# Patient Record
Sex: Male | Born: 1970 | Race: White | Hispanic: No | Smoking: Current every day smoker
Health system: Southern US, Community
[De-identification: ages and names within clinical notes are randomized; demographics above are authoritative.]

## PROBLEM LIST (undated history)

## (undated) DIAGNOSIS — S02609A Fracture of mandible, unspecified, initial encounter for closed fracture: Secondary | ICD-10-CM

## (undated) HISTORY — PX: MANDIBLE SURGERY: SHX707

---

## 1997-09-12 ENCOUNTER — Emergency Department (HOSPITAL_COMMUNITY): Admission: EM | Admit: 1997-09-12 | Discharge: 1997-09-12 | Payer: Self-pay | Admitting: Emergency Medicine

## 2001-04-02 ENCOUNTER — Emergency Department (HOSPITAL_COMMUNITY): Admission: EM | Admit: 2001-04-02 | Discharge: 2001-04-02 | Payer: Self-pay | Admitting: Emergency Medicine

## 2002-06-23 ENCOUNTER — Emergency Department (HOSPITAL_COMMUNITY): Admission: EM | Admit: 2002-06-23 | Discharge: 2002-06-23 | Payer: Self-pay | Admitting: Emergency Medicine

## 2002-09-20 ENCOUNTER — Emergency Department (HOSPITAL_COMMUNITY): Admission: EM | Admit: 2002-09-20 | Discharge: 2002-09-21 | Payer: Self-pay | Admitting: Emergency Medicine

## 2002-09-21 ENCOUNTER — Encounter: Payer: Self-pay | Admitting: Emergency Medicine

## 2002-09-28 ENCOUNTER — Emergency Department (HOSPITAL_COMMUNITY): Admission: EM | Admit: 2002-09-28 | Discharge: 2002-09-28 | Payer: Self-pay | Admitting: Emergency Medicine

## 2004-07-13 ENCOUNTER — Emergency Department (HOSPITAL_COMMUNITY): Admission: EM | Admit: 2004-07-13 | Discharge: 2004-07-13 | Payer: Self-pay | Admitting: Emergency Medicine

## 2005-04-02 ENCOUNTER — Emergency Department (HOSPITAL_COMMUNITY): Admission: EM | Admit: 2005-04-02 | Discharge: 2005-04-02 | Payer: Self-pay | Admitting: Family Medicine

## 2005-04-09 ENCOUNTER — Emergency Department (HOSPITAL_COMMUNITY): Admission: EM | Admit: 2005-04-09 | Discharge: 2005-04-09 | Payer: Self-pay | Admitting: Family Medicine

## 2005-11-18 ENCOUNTER — Emergency Department (HOSPITAL_COMMUNITY): Admission: EM | Admit: 2005-11-18 | Discharge: 2005-11-18 | Payer: Self-pay | Admitting: Emergency Medicine

## 2006-02-18 ENCOUNTER — Emergency Department (HOSPITAL_COMMUNITY): Admission: EM | Admit: 2006-02-18 | Discharge: 2006-02-18 | Payer: Self-pay | Admitting: Emergency Medicine

## 2006-02-19 ENCOUNTER — Ambulatory Visit: Payer: Self-pay | Admitting: Family Medicine

## 2006-02-21 ENCOUNTER — Ambulatory Visit (HOSPITAL_COMMUNITY): Admission: RE | Admit: 2006-02-21 | Discharge: 2006-02-21 | Payer: Self-pay | Admitting: Internal Medicine

## 2006-07-04 ENCOUNTER — Emergency Department (HOSPITAL_COMMUNITY): Admission: EM | Admit: 2006-07-04 | Discharge: 2006-07-04 | Payer: Self-pay | Admitting: Emergency Medicine

## 2007-10-30 ENCOUNTER — Emergency Department (HOSPITAL_COMMUNITY): Admission: EM | Admit: 2007-10-30 | Discharge: 2007-10-30 | Payer: Self-pay | Admitting: Emergency Medicine

## 2008-07-29 ENCOUNTER — Emergency Department (HOSPITAL_COMMUNITY): Admission: EM | Admit: 2008-07-29 | Discharge: 2008-07-29 | Payer: Self-pay | Admitting: Emergency Medicine

## 2008-08-01 ENCOUNTER — Emergency Department (HOSPITAL_COMMUNITY): Admission: EM | Admit: 2008-08-01 | Discharge: 2008-08-01 | Payer: Self-pay | Admitting: Emergency Medicine

## 2008-11-02 ENCOUNTER — Emergency Department (HOSPITAL_COMMUNITY): Admission: EM | Admit: 2008-11-02 | Discharge: 2008-11-02 | Payer: Self-pay | Admitting: Emergency Medicine

## 2010-07-15 LAB — CBC
HCT: 44.8 % (ref 39.0–52.0)
Hemoglobin: 15.6 g/dL (ref 13.0–17.0)
MCHC: 35 g/dL (ref 30.0–36.0)
MCV: 91.6 fL (ref 78.0–100.0)
Platelets: 308 10*3/uL (ref 150–400)
RDW: 12.7 % (ref 11.5–15.5)
WBC: 7 10*3/uL (ref 4.0–10.5)

## 2010-07-15 LAB — POCT I-STAT, CHEM 8
BUN: 16 mg/dL (ref 6–23)
Chloride: 108 mEq/L (ref 96–112)
Creatinine, Ser: 0.7 mg/dL (ref 0.4–1.5)
HCT: 47 % (ref 39.0–52.0)
Hemoglobin: 16 g/dL (ref 13.0–17.0)
Potassium: 3.8 mEq/L (ref 3.5–5.1)
Sodium: 136 mEq/L (ref 135–145)
TCO2: 19 mmol/L (ref 0–100)

## 2010-07-15 LAB — DIFFERENTIAL
Eosinophils Absolute: 0.1 10*3/uL (ref 0.0–0.7)
Monocytes Relative: 9 % (ref 3–12)
Neutro Abs: 3.9 10*3/uL (ref 1.7–7.7)

## 2010-07-15 LAB — POCT CARDIAC MARKERS

## 2010-07-15 LAB — RAPID URINE DRUG SCREEN, HOSP PERFORMED: Benzodiazepines: NOT DETECTED

## 2010-07-18 LAB — POCT I-STAT, CHEM 8
BUN: 6 mg/dL (ref 6–23)
Calcium, Ion: 1.17 mmol/L (ref 1.12–1.32)
Chloride: 104 mEq/L (ref 96–112)
Creatinine, Ser: 0.9 mg/dL (ref 0.4–1.5)
Glucose, Bld: 104 mg/dL — ABNORMAL HIGH (ref 70–99)
Potassium: 3.7 mEq/L (ref 3.5–5.1)

## 2010-11-03 ENCOUNTER — Emergency Department (HOSPITAL_COMMUNITY): Payer: Self-pay

## 2010-11-03 ENCOUNTER — Emergency Department (HOSPITAL_COMMUNITY)
Admission: EM | Admit: 2010-11-03 | Discharge: 2010-11-03 | Disposition: A | Discharge status: Home / Self Care | Payer: Self-pay | Attending: Emergency Medicine | Admitting: Emergency Medicine

## 2010-11-03 DIAGNOSIS — R220 Localized swelling, mass and lump, head: Secondary | ICD-10-CM | POA: Insufficient documentation

## 2010-11-03 DIAGNOSIS — Y998 Other external cause status: Secondary | ICD-10-CM | POA: Insufficient documentation

## 2010-11-03 DIAGNOSIS — R6884 Jaw pain: Secondary | ICD-10-CM | POA: Insufficient documentation

## 2010-11-03 DIAGNOSIS — R109 Unspecified abdominal pain: Secondary | ICD-10-CM

## 2010-11-03 DIAGNOSIS — S0180XA Unspecified open wound of other part of head, initial encounter: Secondary | ICD-10-CM | POA: Insufficient documentation

## 2010-11-03 DIAGNOSIS — S02620A Fracture of subcondylar process of mandible, unspecified side, initial encounter for closed fracture: Secondary | ICD-10-CM | POA: Insufficient documentation

## 2010-11-03 DIAGNOSIS — G8911 Acute pain due to trauma: Secondary | ICD-10-CM

## 2010-11-03 DIAGNOSIS — R51 Headache: Secondary | ICD-10-CM | POA: Insufficient documentation

## 2010-11-03 DIAGNOSIS — S02640A Fracture of ramus of mandible, unspecified side, initial encounter for closed fracture: Secondary | ICD-10-CM | POA: Insufficient documentation

## 2010-11-03 DIAGNOSIS — S02600A Fracture of unspecified part of body of mandible, initial encounter for closed fracture: Secondary | ICD-10-CM | POA: Insufficient documentation

## 2010-11-03 DIAGNOSIS — S81009A Unspecified open wound, unspecified knee, initial encounter: Secondary | ICD-10-CM | POA: Insufficient documentation

## 2010-11-03 LAB — POCT I-STAT, CHEM 8
Calcium, Ion: 1.08 mmol/L — ABNORMAL LOW (ref 1.12–1.32)
Chloride: 102 mEq/L (ref 96–112)
Glucose, Bld: 117 mg/dL — ABNORMAL HIGH (ref 70–99)
Hemoglobin: 19.4 g/dL — ABNORMAL HIGH (ref 13.0–17.0)
Potassium: 3.9 mEq/L (ref 3.5–5.1)
Sodium: 138 mEq/L (ref 135–145)
TCO2: 24 mmol/L (ref 0–100)

## 2010-11-03 LAB — BASIC METABOLIC PANEL
BUN: 6 mg/dL (ref 6–23)
Calcium: 9.2 mg/dL (ref 8.4–10.5)
Creatinine, Ser: 0.68 mg/dL (ref 0.50–1.35)
GFR calc non Af Amer: >60 mL/min (ref >60)
Glucose, Bld: 121 mg/dL — ABNORMAL HIGH (ref 70–99)

## 2010-11-03 LAB — CBC
Hemoglobin: 18.8 g/dL — ABNORMAL HIGH (ref 13.0–17.0)
MCH: 32.8 pg (ref 26.0–34.0)
RBC: 5.74 MIL/uL (ref 4.22–5.81)
WBC: 14 10*3/uL — ABNORMAL HIGH (ref 4.0–10.5)
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
## 2010-12-01 ENCOUNTER — Emergency Department (HOSPITAL_COMMUNITY)
Admission: EM | Admit: 2010-12-01 | Discharge: 2010-12-01 | Disposition: A | Discharge status: Home / Self Care | Payer: Self-pay | Attending: Emergency Medicine | Admitting: Emergency Medicine

## 2010-12-01 ENCOUNTER — Emergency Department (HOSPITAL_COMMUNITY): Payer: Self-pay

## 2010-12-01 DIAGNOSIS — R112 Nausea with vomiting, unspecified: Secondary | ICD-10-CM | POA: Insufficient documentation

## 2010-12-01 DIAGNOSIS — S02609A Fracture of mandible, unspecified, initial encounter for closed fracture: Secondary | ICD-10-CM | POA: Insufficient documentation

## 2010-12-01 DIAGNOSIS — R079 Chest pain, unspecified: Secondary | ICD-10-CM | POA: Insufficient documentation

## 2010-12-01 DIAGNOSIS — S2239XA Fracture of one rib, unspecified side, initial encounter for closed fracture: Secondary | ICD-10-CM | POA: Insufficient documentation

## 2010-12-01 DIAGNOSIS — R51 Headache: Secondary | ICD-10-CM | POA: Insufficient documentation

## 2010-12-01 DIAGNOSIS — X58XXXA Exposure to other specified factors, initial encounter: Secondary | ICD-10-CM | POA: Insufficient documentation

## 2011-09-12 ENCOUNTER — Encounter (HOSPITAL_COMMUNITY): Payer: Self-pay | Admitting: Family Medicine

## 2011-09-12 ENCOUNTER — Encounter (HOSPITAL_COMMUNITY): Payer: Self-pay | Admitting: *Deleted

## 2011-09-12 ENCOUNTER — Emergency Department (HOSPITAL_COMMUNITY): Payer: Self-pay

## 2011-09-12 ENCOUNTER — Emergency Department (HOSPITAL_COMMUNITY)
Admission: EM | Admit: 2011-09-12 | Discharge: 2011-09-12 | Disposition: A | Discharge status: Home / Self Care | Payer: Self-pay | Attending: Emergency Medicine | Admitting: Emergency Medicine

## 2011-09-12 DIAGNOSIS — L03211 Cellulitis of face: Secondary | ICD-10-CM | POA: Insufficient documentation

## 2011-09-12 DIAGNOSIS — K0889 Other specified disorders of teeth and supporting structures: Secondary | ICD-10-CM

## 2011-09-12 DIAGNOSIS — R220 Localized swelling, mass and lump, head: Secondary | ICD-10-CM | POA: Insufficient documentation

## 2011-09-12 DIAGNOSIS — F17200 Nicotine dependence, unspecified, uncomplicated: Secondary | ICD-10-CM | POA: Insufficient documentation

## 2011-09-12 DIAGNOSIS — L0201 Cutaneous abscess of face: Secondary | ICD-10-CM | POA: Insufficient documentation

## 2011-09-12 DIAGNOSIS — K029 Dental caries, unspecified: Secondary | ICD-10-CM

## 2011-09-12 HISTORY — DX: Fracture of mandible, unspecified, initial encounter for closed fracture: S02.609A

## 2011-09-12 MED ORDER — CLINDAMYCIN HCL 300 MG PO CAPS: 300.0000 mg | ORAL_CAPSULE | Freq: Three times a day (TID) | ORAL | Status: DC

## 2011-09-12 MED ORDER — OXYCODONE-ACETAMINOPHEN 5-325 MG PO TABS
2.0000 | ORAL_TABLET | Freq: Once | ORAL | Status: AC
  Administered 2011-09-12: 2 via ORAL
  Filled 2011-09-12: qty 2

## 2011-09-12 MED ORDER — PENICILLIN V POTASSIUM 500 MG PO TABS: 500.0000 mg | ORAL_TABLET | Freq: Three times a day (TID) | ORAL | Status: AC

## 2011-09-12 MED ORDER — HYDROMORPHONE HCL PF 1 MG/ML IJ SOLN
1.0000 mg | Freq: Once | INTRAMUSCULAR | Status: AC
  Administered 2011-09-12: 1 mg via INTRAVENOUS
  Filled 2011-09-12: qty 1

## 2011-09-12 MED ORDER — CLINDAMYCIN PHOSPHATE 600 MG/50ML IV SOLN
600.0000 mg | Freq: Once | INTRAVENOUS | Status: AC
  Administered 2011-09-12: 600 mg via INTRAVENOUS
  Filled 2011-09-12: qty 50

## 2011-09-12 MED ORDER — LIDOCAINE VISCOUS 2 % MT SOLN
20.0000 mL | Freq: Once | OROMUCOSAL | Status: AC
  Administered 2011-09-12: 20 mL via OROMUCOSAL
  Filled 2011-09-12: qty 20

## 2011-09-12 MED ORDER — OXYCODONE-ACETAMINOPHEN 5-325 MG PO TABS: 2.0000 | ORAL_TABLET | ORAL | Status: AC | PRN

## 2011-09-12 MED ORDER — PENICILLIN G BENZATHINE 1200000 UNIT/2ML IM SUSP
1.2000 10*6.[IU] | Freq: Once | INTRAMUSCULAR | Status: AC
  Administered 2011-09-12: 1.2 10*6.[IU] via INTRAMUSCULAR
  Filled 2011-09-12: qty 2

## 2011-09-12 MED ORDER — HYDROMORPHONE HCL PF 1 MG/ML IJ SOLN
1.0000 mg | Freq: Once | INTRAMUSCULAR | Status: AC
Start: 2011-09-12 — End: 2011-09-12
  Administered 2011-09-12: 1 mg via INTRAVENOUS
  Filled 2011-09-12: qty 1

## 2011-09-12 MED ORDER — KETOROLAC TROMETHAMINE 30 MG/ML IJ SOLN
30.0000 mg | Freq: Once | INTRAMUSCULAR | Status: AC
  Administered 2011-09-12: 30 mg via INTRAVENOUS
  Filled 2011-09-12: qty 1

## 2011-09-12 NOTE — Discharge Instructions (Signed)
Please follow-up with Dr. Russella Dar within 24-48 hours to receive care. Be sure to take Clindamycin, given to you here today in the Emergency Department.  Dental Pain A tooth ache may be caused by cavities (tooth decay). Cavities expose the nerve of the tooth to air and hot or cold temperatures. It may come from an infection or abscess (also called a boil or furuncle) around your tooth. It is also often caused by dental caries (tooth decay). This causes the pain you are having. DIAGNOSIS  Your caregiver can diagnose this problem by exam. TREATMENT   If caused by an infection, it may be treated with medications which kill germs (antibiotics) and pain medications as prescribed by your caregiver. Take medications as directed.   Only take over-the-counter or prescription medicines for pain, discomfort, or fever as directed by your caregiver.   Whether the tooth ache today is caused by infection or dental disease, you should see your dentist as soon as possible for further care.  SEEK MEDICAL CARE IF: The exam and treatment you received today has been provided on an emergency basis only. This is not a substitute for complete medical or dental care. If your problem worsens or new problems (symptoms) appear, and you are unable to meet with your dentist, call or return to this location. SEEK IMMEDIATE MEDICAL CARE IF:   You have a fever.   You develop redness and swelling of your face, jaw, or neck.   You are unable to open your mouth.   You have severe pain uncontrolled by pain medicine.  MAKE SURE YOU:   Understand these instructions.   Will watch your condition.   Will get help right away if you are not doing well or get worse.  Document Released: 03/25/2005 Document Revised: 03/14/2011 Document Reviewed: 11/11/2007 [facility] Patient Information 2012 [place], [place].  Cellulitis Cellulitis is an infection of the skin and the tissue beneath it. The area is typically red and tender. It is  caused by germs (bacteria) (usually staph or strep) that enter the body through cuts or sores. Cellulitis most commonly occurs in the arms or lower legs.  HOME CARE INSTRUCTIONS   If you are given a prescription for medications which kill germs (antibiotics), take as directed until finished.   If the infection is on the arm or leg, keep the limb elevated as able.   Use a warm cloth several times per day to relieve pain and encourage healing.   See your caregiver for recheck of the infected site as directed if problems arise.   Only take over-the-counter or prescription medicines for pain, discomfort, or fever as directed by your caregiver.  SEEK MEDICAL CARE IF:   The area of redness (inflammation) is spreading, there are red streaks coming from the infected site, or if a part of the infection begins to turn dark in color.   The joint or bone underneath the infected skin becomes painful after the skin has healed.   The infection returns in the same or another area after it seems to have gone away.   A boil or bump swells up. This may be an abscess.   New, unexplained problems such as pain or fever develop.  SEEK IMMEDIATE MEDICAL CARE IF:   You have a fever.   You or your child feels drowsy or lethargic.   There is vomiting, diarrhea, or lasting discomfort or feeling ill (malaise) with muscle aches and pains.  MAKE SURE YOU:   Understand these instructions.  Will watch your condition.   Will get help right away if you are not doing well or get worse.  Document Released: 01/02/2005 Document Revised: 03/14/2011 Document Reviewed: 11/11/2007 [facility] Patient Information 2012 [place], [place].

## 2011-09-12 NOTE — ED Notes (Signed)
Patient transported to X-ray 

## 2011-09-12 NOTE — Progress Notes (Signed)
Self [place] pt with left side face swelling from tooth infection PA contacted ED CM to assist with [facility] indigent medication assistance .  CM spoke with pt about [facility] indigent medication program, dental resources and self pay [place] [facility] ([facility], [facility] and [facility]) Reviewed generic medications from discount pharmacies and financial assistance resources Rx for clindamycin walked to [facility] pharmacy after confirming with Riki Rusk that pt is eligible for [facility] medication assistance
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
## 2011-09-12 NOTE — Discharge Instructions (Signed)
Dental Caries  Tooth decay (dental caries, cavities) is the most common of all oral diseases. It occurs in all ages but is more common in children and young adults.  CAUSES  Bacteria in your mouth combine with foods (particularly sugars and starches) to produce plaque. Plaque is a substance that sticks to the hard surfaces of teeth. The bacteria in the plaque produce acids that attack the enamel of teeth. Repeated acid attacks dissolve the enamel and create holes in the teeth. Root surfaces of teeth may also get these holes.  Other contributing factors include:   Frequent snacking and drinking of cavity-producing foods and liquids.   Poor oral hygiene.   Dry mouth.   Substance abuse such as methamphetamine.   Broken or poor fitting dental restorations.   Eating disorders.   Gastroesophageal reflux disease (GERD).   Certain radiation treatments to the head and neck.  SYMPTOMS  At first, dental decay appears as white, chalky areas on the enamel. In this early stage, symptoms are seldom present. As the decay progresses, pits and holes may appear on the enamel surfaces. Progression of the decay will lead to softening of the hard layers of the tooth. At this point you may experience some pain or achy feeling after sweet, hot, or cold foods or drinks are consumed. If left untreated, the decay will reach the internal structures of the tooth and produce severe pain. Extensive dental treatment, such as root canal therapy, may be needed to save the tooth at this late stage of decay development.  DIAGNOSIS  Most cavities will be detected during regular check-ups. A thorough medical and dental history will be taken by the dentist. The dentist will use instruments to check the surfaces of your teeth for any breakdown or discoloration. Some dentists have special instruments, such as lasers, that detect tooth decay. Dental X-rays may also show some cavities that are not visible to the eye (such as between  the contact areas of the teeth). TREATMENT  Treatment involves removal of the tooth decay and replacement with a restorative material such as silver, gold, or composite (white) material. However, if the decay involves a large area of the tooth and there is little remaining healthy tooth structure, a cap (crown) will be fitted over the remaining structure. If the decay involves the center part of the tooth (pulp), root canal treatment will be needed before any type of dental restoration is placed. If the tooth is severely destroyed by the decay process, leaving the remaining tooth structures unrestorable, the tooth will need to be pulled (extracted). Some early tooth decay may be reversed by fluoride treatments and thorough brushing and flossing at home. PREVENTION   Eat healthy foods. Restrict the amount of sugary, starchy foods and liquids you consume. Avoid frequent snacking and drinking of unhealthy foods and liquids.   Sealants can help with prevention of cavities. Sealants are composite resins applied onto the biting surfaces of teeth at risk for decay. They smooth out the pits and grooves and prevent food from being trapped in them. This is done in early childhood before tooth decay has started.   Fluoride tablets may also be prescribed to children between 6 months and 10 years of age if your drinking water is not fluoridated. The fluoride absorbed by the tooth enamel makes teeth less susceptible to decay. Thorough daily cleaning with a toothbrush and dental floss is the best way to prevent cavities. Use of a fluoride toothpaste is highly recommended. Fluoride mouth rinses   may be used in specific cases.   Topical application of fluoride by your dentist is important in children.   Regular visits with a dentist for checkups and cleanings are also important.  SEEK IMMEDIATE DENTAL CARE IF:  You have a fever.   You develop redness and swelling of your face, jaw, or neck.   You develop swelling  around a tooth.   You are unable to open your mouth or cannot swallow.   You have severe pain uncontrolled by pain medicine.  Document Released: 12/15/2001 Document Revised: 03/14/2011 Document Reviewed: 08/30/2010 [employer] Patient Information 2012 [employer]. 

## 2011-09-12 NOTE — ED Provider Notes (Signed)
History     CSN: [id number]  Arrival date & time 09/12/11  0009   First MD Initiated Contact with Patient 09/12/11 0224      Chief Complaint  Patient presents with  . Facial Pain    (Consider location/radiation/quality/duration/timing/severity/associated sxs/prior treatment) The history is provided by the patient.   patient here with left upper tooth pain x2 days. History of poor dentition. No fever noted. Pain worse with chewing. Also swelling below his left eye. No change in his vision. Has been using Motrin without relief.  Past Medical History  Diagnosis Date  . Jaw fracture     Past Surgical History  Procedure Date  . Mandible surgery     History reviewed. No pertinent family history.  History  Substance Use Topics  . Smoking status: Current Everyday Smoker -- 1.0 packs/day  . Smokeless tobacco: Not on file  . Alcohol Use: Yes      Review of Systems  All other systems reviewed and are negative.    Allergies  Review of patient's allergies indicates no known allergies.  Home Medications   Current Outpatient Rx  Name Route Sig Dispense Refill  . ACETAMINOPHEN 500 MG PO TABS Oral Take 1,000 mg by mouth every 6 (six) hours as needed. Mouth pain    . IBUPROFEN 200 MG PO TABS Oral Take 800 mg by mouth every 6 (six) hours as needed. Mouth pain      BP 170/96  Pulse 73  Temp(Src) 98.9 F (37.2 C) (Oral)  Resp 20  SpO2 100%  Physical Exam  Nursing note and vitals reviewed. Constitutional: He is oriented to person, place, and time. He appears well-developed and well-nourished.  Non-toxic appearance.  HENT:  Head: Normocephalic and atraumatic.  Mouth/Throat: Dental caries present. No dental abscesses.       Left upper dental caries noted. No visible drainable abscess  Eyes: Conjunctivae are normal. Pupils are equal, round, and reactive to light.  Neck: Normal range of motion.  Cardiovascular: Normal rate.   Pulmonary/Chest: Effort normal.    Neurological: He is alert and oriented to person, place, and time.  Skin: Skin is warm and dry.  Psychiatric: He has a normal mood and affect.    ED Course  Procedures (including critical care time)  Labs Reviewed - No data to display No results found.   No diagnosis found.    MDM  Patient given Bicillin LA here. Also given Percocet for pain. He will followup with his dentist this week        Toy Baker, MD 09/12/11 [phone]

## 2011-09-12 NOTE — ED Notes (Signed)
Pt c/o pain and swelling on left side of face, states thinks "there is a hole in my tooth".  Hx of facial trauma and broken jaw 1 year ago.  Taken ibuprofen and tylenol, last tylenol 10 pm

## 2011-09-12 NOTE — ED Notes (Signed)
Pt reports he was seen at [facility] last night for dental pain to left front upper tooth. Reports was given pain meds and penicillin shot. States given rx for both but states dr did not sign pain pill rx. Reports severe swelling and pain to left upper tooth and cheek area.

## 2011-09-14 MED ORDER — CLINDAMYCIN HCL 150 MG PO CAPS: 300.0000 mg | ORAL_CAPSULE | Freq: Three times a day (TID) | ORAL | Status: AC

## 2011-09-14 NOTE — ED Notes (Signed)
Chart opened to try and obtain a less expensive prescription for his antibiotics. Also pt proceeds to state he was told he could get his meds free at the drug store. Pt upset due to not able to get free meds. Requesting additional pain meds too. He was made aware he can not get narcotics without being seen and there is no way to know for sure if the doctor will write for meds. Case management involved and speaking with pt.

## 2011-09-14 NOTE — Progress Notes (Signed)
Pt presenting in emergency room with facial pain. Pt states did not receive medication from pharmacy via indigent funds when discharged 09/12/11. Pharmacy still holding rx from discharge. Pharmacy to contact CN Patty in emergency for pick up of rx.   Leonie Green [phone]

## 2011-09-16 NOTE — ED Provider Notes (Signed)
Medical screening examination/treatment/procedure(s) were conducted as a shared visit with non-physician practitioner(s) and myself.  I personally evaluated the patient during the encounter  Left facial swelling, induration, ttp, warmth. Poor dentition +tenderness to percussion  Forbes Cellar, MD 09/16/11 1610

## 2012-01-21 ENCOUNTER — Encounter (HOSPITAL_COMMUNITY): Payer: Self-pay | Admitting: Emergency Medicine

## 2012-01-21 ENCOUNTER — Emergency Department (HOSPITAL_COMMUNITY)
Admission: EM | Admit: 2012-01-21 | Discharge: 2012-01-21 | Disposition: A | Discharge status: Home / Self Care | Payer: Self-pay | Attending: Emergency Medicine | Admitting: Emergency Medicine

## 2012-01-21 DIAGNOSIS — S335XXA Sprain of ligaments of lumbar spine, initial encounter: Secondary | ICD-10-CM | POA: Insufficient documentation

## 2012-01-21 DIAGNOSIS — F17200 Nicotine dependence, unspecified, uncomplicated: Secondary | ICD-10-CM | POA: Insufficient documentation

## 2012-01-21 DIAGNOSIS — Y93H3 Activity, building and construction: Secondary | ICD-10-CM | POA: Insufficient documentation

## 2012-01-21 DIAGNOSIS — Y9269 Other specified industrial and construction area as the place of occurrence of the external cause: Secondary | ICD-10-CM | POA: Insufficient documentation

## 2012-01-21 DIAGNOSIS — M545 Low back pain, unspecified: Secondary | ICD-10-CM | POA: Insufficient documentation

## 2012-01-21 DIAGNOSIS — S39012A Strain of muscle, fascia and tendon of lower back, initial encounter: Secondary | ICD-10-CM

## 2012-01-21 DIAGNOSIS — M62838 Other muscle spasm: Secondary | ICD-10-CM

## 2012-01-21 MED ORDER — CYCLOBENZAPRINE HCL 10 MG PO TABS
10.0000 mg | ORAL_TABLET | Freq: Once | ORAL | Status: AC
  Administered 2012-01-21: 10 mg via ORAL
  Filled 2012-01-21: qty 1

## 2012-01-21 MED ORDER — CYCLOBENZAPRINE HCL 10 MG PO TABS: 10.0000 mg | ORAL_TABLET | Freq: Two times a day (BID) | ORAL | Status: DC | PRN

## 2012-01-21 MED ORDER — IBUPROFEN 800 MG PO TABS
800.0000 mg | ORAL_TABLET | Freq: Three times a day (TID) | ORAL | Status: DC
Start: 2012-01-21 — End: 2015-11-07

## 2012-01-21 MED ORDER — OXYCODONE-ACETAMINOPHEN 5-325 MG PO TABS
2.0000 | ORAL_TABLET | Freq: Once | ORAL | Status: AC
  Administered 2012-01-21: 2 via ORAL
  Filled 2012-01-21: qty 2

## 2012-01-21 NOTE — ED Notes (Signed)
Pt dc to home. States understanding to dc instructions.  Pt ambulatory to exit without difficulty.

## 2012-01-21 NOTE — ED Provider Notes (Signed)
History     CSN: [id number]  Arrival date & time 01/21/12  1904   First MD Initiated Contact with Patient 01/21/12 1958      Chief Complaint  Patient presents with  . Back Pain    (Consider location/radiation/quality/duration/timing/severity/associated sxs/prior treatment) HPI Comments: 41 y/o male presents to the ED complaining of left sided low back pain after work around 4:30 pm today. 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 He went home and tried to "rest it off" but states it got worse throughout the afternoon, went to the grocery store and by the time he got home his pain became sharp rated 10/10. Once he sat down and took 2 ibuprofen his pain decreased to around a 4/10. States movement or standing make the pain worse, along with talking a lot. States talking now is flaring up the pain. Admits to pain down the back of his left leg to the medial aspect of his knee. No numbness or tingling. Denies saddle anesthesia or loss of control of bowels or bladder. States when the pain gets sharp enough he feels slightly nauseated. Never hurt his back before.  Patient is a 41 y.o. male presenting with back pain. The history is provided by the patient.  Back Pain  Pertinent negatives include no chest pain, no fever, no numbness and no abdominal pain.    Past Medical History  Diagnosis Date  . Jaw fracture     Past Surgical History  Procedure Date  . Mandible surgery     History reviewed. No pertinent family history.  History  Substance Use Topics  . Smoking status: Current Every Day Smoker -- 1.0 packs/day  . Smokeless tobacco: Not on file  . Alcohol Use: Yes      Review of Systems  Constitutional: Negative for fever and chills.  HENT: Negative for neck pain and neck stiffness.   Respiratory: Negative for shortness of breath.   Cardiovascular: Negative for chest pain.  Gastrointestinal: Positive  for nausea. Negative for vomiting and abdominal pain.       No bowel dysfunction  Genitourinary:       No urinary or bladder dysfunction  Musculoskeletal: Positive for back pain and gait problem.  Neurological: Negative for numbness.    Allergies  Review of patient's allergies indicates no known allergies.  Home Medications  No current outpatient prescriptions on file.  BP 153/103  Pulse 90  Temp 98.4 F (36.9 C) (Oral)  Resp 18  SpO2 99%  Physical Exam  Nursing note and vitals reviewed. Constitutional: He is oriented to person, place, and time. He appears well-developed and well-nourished.       Uncomfortable   HENT:  Head: Normocephalic and atraumatic.  Eyes: Conjunctivae normal and EOM are normal.  Neck: Normal range of motion. Neck supple.  Cardiovascular: Normal rate, regular rhythm, normal heart sounds and intact distal pulses.   Pulmonary/Chest: Effort normal and breath sounds normal.  Abdominal: Soft. Bowel sounds are normal. There is no tenderness.  Musculoskeletal:       Lumbar back: He exhibits decreased range of motion (in all directions due to pain), tenderness (left paraspinal muscles) and spasm (left paraspinal muscles). He exhibits no bony tenderness and normal pulse.  Neurological: He is alert and oriented to person, place, and time. He has normal reflexes. No sensory deficit.  Skin: Skin is warm, dry and intact. No rash noted.  No erythema.  Psychiatric: He has a normal mood and affect. His speech is normal and behavior is normal.    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 Notable muscle spasm in left paraspinal muscles. No red flags concerning patient's back pain. No s/s of central cord compression or cauda equina. Lower extremities are neurovascularly intact and patient is able to ambulate, just slowly. Will give muscle relaxer, ibuprofen 800mg  to  go home with. Pain medication given in ED.        Trevor Mace, PA-C 01/21/12 2031

## 2012-01-21 NOTE — ED Notes (Signed)
Pt c/o left lower back pain after working today

## 2012-01-21 NOTE — ED Notes (Signed)
Pt states "was at work today and bent down wrong and has had pain in back ever since". Pt sitting up in bed watching tv using phone.

## 2012-01-22 NOTE — ED Provider Notes (Signed)
Medical screening examination/treatment/procedure(s) were performed by non-physician practitioner and as supervising physician I was immediately available for consultation/collaboration.  Britnay Magnussen, MD 01/22/12 0115 

## 2013-01-18 ENCOUNTER — Emergency Department (INDEPENDENT_AMBULATORY_CARE_PROVIDER_SITE_OTHER)
Admission: EM | Admit: 2013-01-18 | Discharge: 2013-01-18 | Disposition: A | Discharge status: Home / Self Care | Payer: Medicaid Other | Source: Home / Self Care | Attending: Emergency Medicine | Admitting: Emergency Medicine

## 2013-01-18 ENCOUNTER — Other Ambulatory Visit (HOSPITAL_COMMUNITY)
Admission: RE | Admit: 2013-01-18 | Discharge: 2013-01-18 | Disposition: A | Discharge status: Home / Self Care | Payer: Medicaid Other | Source: Ambulatory Visit | Attending: Emergency Medicine | Admitting: Emergency Medicine

## 2013-01-18 ENCOUNTER — Encounter (HOSPITAL_COMMUNITY): Payer: Self-pay | Admitting: Emergency Medicine

## 2013-01-18 DIAGNOSIS — Z113 Encounter for screening for infections with a predominantly sexual mode of transmission: Secondary | ICD-10-CM | POA: Insufficient documentation

## 2013-01-18 DIAGNOSIS — N453 Epididymo-orchitis: Secondary | ICD-10-CM

## 2013-01-18 DIAGNOSIS — N451 Epididymitis: Secondary | ICD-10-CM

## 2013-01-18 LAB — POCT URINALYSIS DIP (DEVICE)
Bilirubin Urine: NEGATIVE
Glucose, UA: NEGATIVE mg/dL
Leukocytes, UA: NEGATIVE
Nitrite: NEGATIVE
Urobilinogen, UA: 0.2 mg/dL (ref 0.0–1.0)

## 2013-01-18 LAB — HIV ANTIBODY (ROUTINE TESTING W REFLEX): HIV: NONREACTIVE

## 2013-01-18 MED ORDER — CIPROFLOXACIN HCL 500 MG PO TABS: 500.0000 mg | ORAL_TABLET | Freq: Two times a day (BID) | ORAL | Status: DC

## 2013-01-18 MED ORDER — CLONAZEPAM 0.5 MG PO TABS: 0.5000 mg | ORAL_TABLET | Freq: Two times a day (BID) | ORAL | Status: DC | PRN

## 2013-01-18 NOTE — ED Notes (Signed)
[facility] called questioning clonazepam hard copy script, no signature.  verified prescription. Dr Lorenz Coaster notified

## 2013-01-18 NOTE — ED Notes (Signed)
Multiple c/o . States 1 week ago, had 'rowdy sex' w his girlfriend, and that was followed w ~24 h swelling in groin area , and yhas been experiencing trouble w urination ( cant completely empty bladder) some distant concern for STD exposure and ED and HA

## 2013-01-18 NOTE — ED Notes (Signed)
Please call pt on his cell for any lab report issues

## 2013-01-18 NOTE — ED Provider Notes (Signed)
Chief Complaint:   Chief Complaint  Patient presents with  . Testicle Pain    History of Present Illness:   Keith [name] is a 42 year old male who has had a one-week history of right testicular pain and swelling. This has been tender to touch. He denies any dysuria, frequency, urgency, hematuria, or urethral discharge. He has not had any penile lesions, inguinal adenopathy, abdominal pain, fevers, chills, nausea, or vomiting. No prior history of similar episodes in the past. The patient has not been sexually active for months since his wife left him. He is concerned about the possibility of STDs, since he thinks she may have been unfaithful.  He also mentions that since his wife left him he's been there was an anxious. He would like something for this. He denies depression or suicidal or homicidal ideation.  Review of Systems:  Other than noted above, the patient denies any of the following symptoms: General:  No fevers, chills, sweats, aches, or fatigue. GI:  No abdominal pain, back pain, nausea, vomiting, diarrhea, or constipation. GU:  No dysuria, frequency, urgency, hematuria, urethral discharge, penile lesions, penile pain, testicular pain, swelling, or mass, inguinal lymphadenopathy or incontinence.  PMFSH:  Past medical history, family history, social history, meds, and allergies were reviewed.    Physical Exam:   Vital signs:  BP 133/86  Pulse 70  Temp(Src) 98 F (36.7 C) (Oral)  Resp 16  SpO2 98% Gen:  Alert, oriented, in no distress. Lungs:  Clear to auscultation, no wheezes, rales or rhonchi. Heart:  Regular rhythm, no gallop or murmer. Abdomen:  Flat and soft.  No tenderness to palpation, guarding, or rebound.  No hepato-splenomegaly or mass.  Bowel sounds were normally active.  No hernia. Genital exam:  Testes are not enlarged or swollen. There is no mass. The epididymis of the right testis is a little bit prominent and more tender than the left. There is no hernia, no  urethral discharge, no genital lesions. Back:  No CVA tenderness.  Skin:  Clear, warm and dry.  Other Labs Obtained at Urgent Care Center:  DNA probe for gonorrhea, Chlamydia, Trichomonas were obtained as well as serologies for HIV and syphilis.  Results are pending at this time and we will call about any positive results.  Assessment: The encounter diagnosis was Epididymitis.   No evidence of testicular torsion or STDs.  Plan:   1.  Meds:  The following meds were prescribed:   New Prescriptions   CIPROFLOXACIN (CIPRO) 500 MG TABLET    Take 1 tablet (500 mg total) by mouth every 12 (twelve) hours.   CLONAZEPAM (KLONOPIN) 0.5 MG TABLET    Take 1 tablet (0.5 mg total) by mouth 2 (two) times daily as needed for anxiety.    2.  Patient Education/Counseling:  The patient was given appropriate handouts, self care instructions, and instructed in symptomatic relief.  We will call him back if any of the DNA probes come back positive for STDs. I told that we did not treat anxiety long-term her that would give him a few clonazepam and he should seek a primary care physician.  3.  Follow up:  The patient was told to follow up if no better in 3 to 4 days, if becoming worse in any way, and given some red flag symptoms such as worsening pain, swelling, or difficulty urinating which would prompt immediate return.  Follow up here as needed.      Reuben Likes, MD 01/18/13 [phone]

## 2013-01-19 LAB — URINE CULTURE

## 2013-07-12 ENCOUNTER — Telehealth: Payer: Self-pay | Admitting: Neurology

## 2013-07-12 NOTE — Telephone Encounter (Signed)
Error - Wrong patient.  07-12-13.  Keith [name]

## 2013-07-12 NOTE — Telephone Encounter (Signed)
Pt's wife called and stated that Mr. Keith [name]'s right arm/side is numb.  It started yesterday and is worse than its ever been.  She would like to know when they would be able to come in.  Please call to advise.  Thank you.

## 2013-07-12 NOTE — Telephone Encounter (Signed)
Called and left VM message for call back,patient is not a patient at our office

## 2013-12-30 ENCOUNTER — Encounter (HOSPITAL_COMMUNITY): Payer: Self-pay | Admitting: Emergency Medicine

## 2013-12-30 ENCOUNTER — Emergency Department (INDEPENDENT_AMBULATORY_CARE_PROVIDER_SITE_OTHER)
Admission: EM | Admit: 2013-12-30 | Discharge: 2013-12-30 | Discharge status: Against Medical Advice | Payer: Self-pay | Source: Home / Self Care

## 2013-12-30 ENCOUNTER — Emergency Department (HOSPITAL_COMMUNITY)
Admission: EM | Admit: 2013-12-30 | Discharge: 2013-12-30 | Discharge status: Against Medical Advice | Payer: Medicaid Other | Attending: Emergency Medicine | Admitting: Emergency Medicine

## 2013-12-30 DIAGNOSIS — F411 Generalized anxiety disorder: Secondary | ICD-10-CM | POA: Insufficient documentation

## 2013-12-30 DIAGNOSIS — F4329 Adjustment disorder with other symptoms: Secondary | ICD-10-CM

## 2013-12-30 DIAGNOSIS — F430 Acute stress reaction: Secondary | ICD-10-CM | POA: Insufficient documentation

## 2013-12-30 DIAGNOSIS — G44219 Episodic tension-type headache, not intractable: Secondary | ICD-10-CM | POA: Insufficient documentation

## 2013-12-30 DIAGNOSIS — F4320 Adjustment disorder, unspecified: Secondary | ICD-10-CM | POA: Insufficient documentation

## 2013-12-30 DIAGNOSIS — F17200 Nicotine dependence, unspecified, uncomplicated: Secondary | ICD-10-CM | POA: Insufficient documentation

## 2013-12-30 DIAGNOSIS — F419 Anxiety disorder, unspecified: Secondary | ICD-10-CM

## 2013-12-30 DIAGNOSIS — G44211 Episodic tension-type headache, intractable: Secondary | ICD-10-CM

## 2013-12-30 DIAGNOSIS — R209 Unspecified disturbances of skin sensation: Secondary | ICD-10-CM | POA: Insufficient documentation

## 2013-12-30 NOTE — ED Provider Notes (Signed)
CSN: [id number]     Arrival date & time 12/30/13  1102 History   None    No chief complaint on file.  (Consider location/radiation/quality/duration/timing/severity/associated sxs/prior Treatment) HPI Comments: 43 year old male presents with a complaint of headache for the past week. He states he is under a lot of stress and believes that this is causing his headache. He is having her problems due to his personal life and social problems related to separation from his wife and having to take care of 3 girls. He states the headache is located to the left parietal, vertex and occiput area. Sometimes it is throbbing. Today he rates as a 1-2/10. Occasionally he will have tingling in the same areas. He has had nausea but no vomiting. No photophobia, no problems with vision, speech, hearing, swallowing. Denies ataxia or other conditions of discoordination. He just states that he is nervous and under stress.   Past Medical History  Diagnosis Date  . Jaw fracture    Past Surgical History  Procedure Laterality Date  . Mandible surgery     No family history on file. History  Substance Use Topics  . Smoking status: Current Every Day Smoker -- 1.00 packs/day  . Smokeless tobacco: Not on file  . Alcohol Use: Yes    Review of Systems  Constitutional: Negative.   Respiratory: Negative for cough and shortness of breath.   Cardiovascular: Negative for chest pain and leg swelling.  Gastrointestinal: Negative.   Genitourinary: Negative.   Skin: Negative for rash.  Neurological: Positive for numbness and headaches. Negative for seizures, syncope and speech difficulty.  Psychiatric/Behavioral: Positive for sleep disturbance and dysphoric mood. The patient is nervous/anxious.     Allergies  Review of patient's allergies indicates no known allergies.  Home Medications   Prior to Admission medications   Medication Sig Start Date End Date Taking? Authorizing Provider  ciprofloxacin (CIPRO) 500 MG  tablet Take 1 tablet (500 mg total) by mouth every 12 (twelve) hours. 01/18/13   Reuben Likes, MD  clonazePAM (KLONOPIN) 0.5 MG tablet Take 1 tablet (0.5 mg total) by mouth 2 (two) times daily as needed for anxiety. 01/18/13   Reuben Likes, MD  cyclobenzaprine (FLEXERIL) 10 MG tablet Take 1 tablet (10 mg total) by mouth 2 (two) times daily as needed for muscle spasms. 01/21/12   Trevor Mace, PA-C  ibuprofen (ADVIL,MOTRIN) 800 MG tablet Take 1 tablet (800 mg total) by mouth 3 (three) times daily. 01/21/12   Trevor Mace, PA-C   There were no vitals taken for this visit. Physical Exam  Nursing note and vitals reviewed. Constitutional: He is oriented to person, place, and time. He appears well-developed and well-nourished. No distress.  HENT:  Nose: Nose normal.  Mouth/Throat: No oropharyngeal exudate.  Bilateral TMs are normal Oropharynx is clear moist. Some pallor symmetrically. Tongue midline.  Eyes: Conjunctivae and EOM are normal. Pupils are equal, round, and reactive to light. Left eye exhibits no discharge.  Neck: Normal range of motion. Neck supple. No thyromegaly present.  Cardiovascular: Normal rate, regular rhythm, normal heart sounds and intact distal pulses.   No murmur heard. Pulmonary/Chest: Effort normal and breath sounds normal. No respiratory distress.  Musculoskeletal: Normal range of motion. He exhibits no edema and no tenderness.  Lymphadenopathy:    He has no cervical adenopathy.  Neurological: He is alert and oriented to person, place, and time. He has normal strength and normal reflexes. He displays no tremor. No cranial nerve deficit or sensory  deficit. He exhibits normal muscle tone. He displays a negative Romberg sign. He displays no seizure activity. Coordination and gait normal.  Heel to toe nl Toe ascension nl Speech clear and goal oriented.  Skin: Skin is warm and dry.  Psychiatric: His speech is normal. Thought content normal. His mood appears anxious.     ED Course  Procedures (including critical care time) Labs Review Labs Reviewed - No data to display  Imaging Review No results found.   MDM   1. Intractable episodic tension-type headache   2. Stress and adjustment reaction   3. Anxiety    Offered toradol 60 mg IM and pt refused Toradol 10 mg tabs Referral to [facility] and Maggie to assist with referral.  When I left the exam room I began to speak with maggie. About 2 minutes later the pt and his dtr walked out of the room, the lobby and left the building. He was asked if he wanted his rx's and referrals and st "no". He then pointed his finger at me and st You have a good day!" and left.     Hayden Rasmussen, NP 12/30/13 1117  Hayden Rasmussen, NP 12/30/13 [phone]

## 2013-12-30 NOTE — ED Notes (Signed)
Pt decided to leave and not be seen for further treatment after Nurse Practitioner had already did assessment to treat. Pt father took daughter and left urgent care.

## 2013-12-30 NOTE — ED Notes (Signed)
Pt called again for reassessment

## 2013-12-30 NOTE — ED Notes (Signed)
Pt sts under a lot of stress and feels anxious and mood swings at home; pt sts raising three daughters alone and finding difficulty managing their care and working; pt sts financial hardship and requesting help to manage "his nerves"; pt denies SI/HI

## 2013-12-30 NOTE — ED Notes (Signed)
Pt states that he has had a headache over the last week. He does state that he has been under a lot of stress weith recent loss of job and rasing 3 girls by his self

## 2013-12-30 NOTE — ED Notes (Signed)
Patient called for reassessment with no answer from Waiting Room

## 2013-12-30 NOTE — Discharge Instructions (Signed)
Headaches, Frequently Asked Questions °MIGRAINE HEADACHES °Q: What is migraine? What causes it? How can I treat it? °A: Generally, migraine headaches begin as a dull ache. Then they develop into a constant, throbbing, and pulsating pain. You may experience pain at the temples. You may experience pain at the front or back of one or both sides of the head. The pain is usually accompanied by a combination of: °· Nausea. °· Vomiting. °· Sensitivity to light and noise. °Some people (about 15%) experience an aura (see below) before an attack. The cause of migraine is believed to be chemical reactions in the brain. Treatment for migraine may include over-the-counter or prescription medications. It may also include self-help techniques. These include relaxation training and biofeedback.  °Q: What is an aura? °A: About 15% of people with migraine get an "aura". This is a sign of neurological symptoms that occur before a migraine headache. You may see wavy or jagged lines, dots, or flashing lights. You might experience tunnel vision or blind spots in one or both eyes. The aura can include visual or auditory hallucinations (something imagined). It may include disruptions in smell (such as strange odors), taste or touch. Other symptoms include: °· Numbness. °· A "pins and needles" sensation. °· Difficulty in recalling or speaking the correct word. °These neurological events may last as long as 60 minutes. These symptoms will fade as the headache begins. °Q: What is a trigger? °A: Certain physical or environmental factors can lead to or "trigger" a migraine. These include: °· Foods. °· Hormonal changes. °· Weather. °· Stress. °It is important to remember that triggers are different for everyone. To help prevent migraine attacks, you need to figure out which triggers affect you. Keep a headache diary. This is a good way to track triggers. The diary will help you talk to your healthcare professional about your condition. °Q: Does  weather affect migraines? °A: Bright sunshine, hot, humid conditions, and drastic changes in barometric pressure may lead to, or "trigger," a migraine attack in some people. But studies have shown that weather does not act as a trigger for everyone with migraines. °Q: What is the link between migraine and hormones? °A: Hormones start and regulate many of your body's functions. Hormones keep your body in balance within a constantly changing environment. The levels of hormones in your body are unbalanced at times. Examples are during menstruation, pregnancy, or menopause. That can lead to a migraine attack. In fact, about three quarters of all women with migraine report that their attacks are related to the menstrual cycle.  °Q: Is there an increased risk of stroke for migraine sufferers? °A: The likelihood of a migraine attack causing a stroke is very remote. That is not to say that migraine sufferers cannot have a stroke associated with their migraines. In persons under age 40, the most common associated factor for stroke is migraine headache. But over the course of a person's normal life span, the occurrence of migraine headache may actually be associated with a reduced risk of dying from cerebrovascular disease due to stroke.  °Q: What are acute medications for migraine? °A: Acute medications are used to treat the pain of the headache after it has started. Examples over-the-counter medications, NSAIDs, ergots, and triptans.  °Q: What are the triptans? °A: Triptans are the newest class of abortive medications. They are specifically targeted to treat migraine. Triptans are vasoconstrictors. They moderate some chemical reactions in the brain. The triptans work on receptors in your brain. Triptans help   to restore the balance of a neurotransmitter called serotonin. Fluctuations in levels of serotonin are thought to be a main cause of migraine.  °Q: Are over-the-counter medications for migraine effective? °A:  Over-the-counter, or "OTC," medications may be effective in relieving mild to moderate pain and associated symptoms of migraine. But you should see your caregiver before beginning any treatment regimen for migraine.  °Q: What are preventive medications for migraine? °A: Preventive medications for migraine are sometimes referred to as "prophylactic" treatments. They are used to reduce the frequency, severity, and length of migraine attacks. Examples of preventive medications include antiepileptic medications, antidepressants, beta-blockers, calcium channel blockers, and NSAIDs (nonsteroidal anti-inflammatory drugs). °Q: Why are anticonvulsants used to treat migraine? °A: During the past few years, there has been an increased interest in antiepileptic drugs for the prevention of migraine. They are sometimes referred to as "anticonvulsants". Both epilepsy and migraine may be caused by similar reactions in the brain.  °Q: Why are antidepressants used to treat migraine? °A: Antidepressants are typically used to treat people with depression. They may reduce migraine frequency by regulating chemical levels, such as serotonin, in the brain.  °Q: What alternative therapies are used to treat migraine? °A: The term "alternative therapies" is often used to describe treatments considered outside the scope of conventional Western medicine. Examples of alternative therapy include acupuncture, acupressure, and yoga. Another common alternative treatment is herbal therapy. Some herbs are believed to relieve headache pain. Always discuss alternative therapies with your caregiver before proceeding. Some herbal products contain arsenic and other toxins. °TENSION HEADACHES °Q: What is a tension-type headache? What causes it? How can I treat it? °A: Tension-type headaches occur randomly. They are often the result of temporary stress, anxiety, fatigue, or anger. Symptoms include soreness in your temples, a tightening band-like sensation  around your head (a "vice-like" ache). Symptoms can also include a pulling feeling, pressure sensations, and contracting head and neck muscles. The headache begins in your forehead, temples, or the back of your head and neck. Treatment for tension-type headache may include over-the-counter or prescription medications. Treatment may also include self-help techniques such as relaxation training and biofeedback. °CLUSTER HEADACHES °Q: What is a cluster headache? What causes it? How can I treat it? °A: Cluster headache gets its name because the attacks come in groups. The pain arrives with little, if any, warning. It is usually on one side of the head. A tearing or bloodshot eye and a runny nose on the same side of the headache may also accompany the pain. Cluster headaches are believed to be caused by chemical reactions in the brain. They have been described as the most severe and intense of any headache type. Treatment for cluster headache includes prescription medication and oxygen. °SINUS HEADACHES °Q: What is a sinus headache? What causes it? How can I treat it? °A: When a cavity in the bones of the face and skull (a sinus) becomes inflamed, the inflammation will cause localized pain. This condition is usually the result of an allergic reaction, a tumor, or an infection. If your headache is caused by a sinus blockage, such as an infection, you will probably have a fever. An x-ray will confirm a sinus blockage. Your caregiver's treatment might include antibiotics for the infection, as well as antihistamines or decongestants.  °REBOUND HEADACHES °Q: What is a rebound headache? What causes it? How can I treat it? °A: A pattern of taking acute headache medications too often can lead to a condition known as "rebound headache."   A pattern of taking too much headache medication includes taking it more than 2 days per week or in excessive amounts. That means more than the label or a caregiver advises. With rebound  headaches, your medications not only stop relieving pain, they actually begin to cause headaches. Doctors treat rebound headache by tapering the medication that is being overused. Sometimes your caregiver will gradually substitute a different type of treatment or medication. Stopping may be a challenge. Regularly overusing a medication increases the potential for serious side effects. Consult a caregiver if you regularly use headache medications more than 2 days per week or more than the label advises. ADDITIONAL QUESTIONS AND ANSWERS Q: What is biofeedback? A: Biofeedback is a self-help treatment. Biofeedback uses special equipment to monitor your body's involuntary physical responses. Biofeedback monitors:  Breathing.  Pulse.  Heart rate.  Temperature.  Muscle tension.  Brain activity. Biofeedback helps you refine and perfect your relaxation exercises. You learn to control the physical responses that are related to stress. Once the technique has been mastered, you do not need the equipment any more. Q: Are headaches hereditary? A: Four out of five (80%) of people that suffer report a family history of migraine. Scientists are not sure if this is genetic or a family predisposition. Despite the uncertainty, a child has a 50% chance of having migraine if one parent suffers. The child has a 75% chance if both parents suffer.  Q: Can children get headaches? A: By the time they reach high school, most young people have experienced some type of headache. Many safe and effective approaches or medications can prevent a headache from occurring or stop it after it has begun.  Q: What type of doctor should I see to diagnose and treat my headache? A: Start with your primary caregiver. Discuss his or her experience and approach to headaches. Discuss methods of classification, diagnosis, and treatment. Your caregiver may decide to recommend you to a headache specialist, depending upon your symptoms or other  physical conditions. Having diabetes, allergies, etc., may require a more comprehensive and inclusive approach to your headache. The National Head[employer] Foundation will provide, upon request, a list of [facility] physician members in your state. Document Released: 06/15/2003 Document Revised: 06/17/2011 Document Reviewed: 11/23/2007 [facility] Patient Information 2015 [place], [place]. This information is not intended to replace advice given to you by your health care provider. Make sure you discuss any questions you have with your health care provider.  Social Anxiety Disorder Social anxiety disorder, previously called social phobia, is a mental disorder. People with social anxiety disorder frequently feel nervous, afraid, or embarrassed when around other people in social situations. They constantly worry that other people are judging or criticizing them for how they look, what they say, or how they act. They may worry that other people might reject them because of their appearance or behavior. Social anxiety disorder is more than just occasional shyness or self-consciousness. It can cause severe emotional distress. It can interfere with daily life activities. Social anxiety disorder also may lead to excessive alcohol or drug use and even suicide.  Social anxiety disorder is actually one of the most common mental disorders. It can develop at any time but usually starts in the teenage years. Women are more commonly affected than men. Social anxiety disorder is also more common in people who have family members with anxiety disorders. It also is more common in people who have physical deformities or conditions with characteristics that are obvious to others, such  as stuttered speech or movement abnormalities (Parkinson disease).  SYMPTOMS  In addition to feeling anxious or fearful in social situations, people with social anxiety disorder frequently have physical symptoms. Examples include:  Red face  (blushing).  Racing heart.  Sweating.  Shaky hands or voice.  Confusion.  Light-headedness.  Upset stomach and diarrhea. DIAGNOSIS  Social anxiety disorder is diagnosed through an assessment by your health care provider. Your health care provider will ask you questions about your mood, thoughts, and reactions in social situations. Your health care provider may ask you about your medical history and use of alcohol or drugs, including prescription medicines. Certain medical conditions and the use of certain substances, including caffeine, can cause symptoms similar to social anxiety disorder. Your health care provider may refer you to a mental health specialist for further evaluation or treatment. The criteria for diagnosis of social anxiety disorder are:  Marked fear or anxiety in one or more social situations in which you may be closely watched or studied by others. Examples of such situations include:  Interacting socially (having a conversation with others, going to a party, or meeting strangers).  Being observed (eating or drinking in public or being called on in class).  Performing in front of others (giving a speech).  The social situations of concern almost always cause fear or anxiety, not just occasionally.  People with social anxiety disorder fear that they will be viewed negatively in a way that will be embarrassing, will lead to rejection, or will offend others. This fear is out of proportion to the actual threat posed by the social situation.  Often the triggering social situations are avoided, or they are endured with intense fear or anxiety. The fear, anxiety, or avoidance is persistent and lasts for 6 months or longer.  The anxiety causes difficulty functioning in at least some parts of your daily life. TREATMENT  Several types of treatment are available for social anxiety disorder. These treatments are often used in combination and include:   Talk therapy. Group  talk therapy allows you to see that you are not alone with these problems. Individual talk therapy helps you address your specific anxiety issues with a caring professional. The most effective forms of talk therapy for social anxiety disorder are cognitive-behavioral therapy and exposure therapy. Cognitive-behavioral therapy helps you to identify and change negative thoughts and beliefs that are at the root of the disorder. Exposure therapy allows you to gradually face the situations that you fear most.  Relaxation and coping techniques. These include deep breathing, self-talk, meditation, visual imagery, and yoga. Relaxation techniques help to keep you calm in social situations.  Social Optician, dispensing.Social skills can be learned on your own or with the help of a talk therapist. They can help you feel more confident and comfortable in social situations.  Medicine. For anxiety limited to performance situations (performance anxiety), medicine called beta blockers can help by reducing or preventing the physical symptoms of social anxiety disorder. For more persistent and generalized social anxiety, antidepressant medicine may be prescribed to help control symptoms. In severe cases of social anxiety disorder, strong antianxiety medicine, called benzodiazepines, may be prescribed on a limited basis and for a short time. Document Released: 02/21/2005 Document Revised: 08/09/2013 Document Reviewed: 06/23/2012 [facility] Patient Information 2015 [place], [place]. This information is not intended to replace advice given to you by your health care provider. Make sure you discuss any questions you have with your health care provider.  Tension Headache A tension headache  is a feeling of pain, pressure, or aching often felt over the front and sides of the head. The pain can be dull or can feel tight (constricting). It is the most common type of headache. Tension headaches are not normally associated with nausea or  vomiting and do not get worse with physical activity. Tension headaches can last 30 minutes to several days.  CAUSES  The exact cause is not known, but it may be caused by chemicals and hormones in the brain that lead to pain. Tension headaches often begin after stress, anxiety, or depression. Other triggers may include:  Alcohol.  Caffeine (too much or withdrawal).  Respiratory infections (colds, flu, sinus infections).  Dental problems or teeth clenching.  Fatigue.  Holding your head and neck in one position too long while using a computer. SYMPTOMS   Pressure around the head.   Dull, aching head pain.   Pain felt over the front and sides of the head.   Tenderness in the muscles of the head, neck, and shoulders. DIAGNOSIS  A tension headache is often diagnosed based on:   Symptoms.   Physical examination.   A CT scan or MRI of your head. These tests may be ordered if symptoms are severe or unusual. TREATMENT  Medicines may be given to help relieve symptoms.  HOME CARE INSTRUCTIONS   Only take over-the-counter or prescription medicines for pain or discomfort as directed by your caregiver.   Lie down in a dark, quiet room when you have a headache.   Keep a journal to find out what may be triggering your headaches. For example, write down:  What you eat and drink.  How much sleep you get.  Any change to your diet or medicines.  Try massage or other relaxation techniques.   Ice packs or heat applied to the head and neck can be used. Use these 3 to 4 times per day for 15 to 20 minutes each time, or as needed.   Limit stress.   Sit up straight, and do not tense your muscles.   Quit smoking if you smoke.  Limit alcohol use.  Decrease the amount of caffeine you drink, or stop drinking caffeine.  Eat and exercise regularly.  Get 7 to 9 hours of sleep, or as recommended by your caregiver.  Avoid excessive use of pain medicine as recurrent headaches  can occur.  SEEK MEDICAL CARE IF:   You have problems with the medicines you were prescribed.  Your medicines do not work.  You have a change from the usual headache.  You have nausea or vomiting. SEEK IMMEDIATE MEDICAL CARE IF:   Your headache becomes severe.  You have a fever.  You have a stiff neck.  You have loss of vision.  You have muscular weakness or loss of muscle control.  You lose your balance or have trouble walking.  You feel faint or pass out.  You have severe symptoms that are different from your first symptoms. MAKE SURE YOU:   Understand these instructions.  Will watch your condition.  Will get help right away if you are not doing well or get worse. Document Released: 03/25/2005 Document Revised: 06/17/2011 Document Reviewed: 03/15/2011 [facility] Patient Information 2015 [place], [place]. This information is not intended to replace advice given to you by your health care provider. Make sure you discuss any questions you have with your health care provider.

## 2014-01-01 NOTE — ED Provider Notes (Signed)
Medical screening examination/treatment/procedure(s) were performed by a resident physician or non-physician practitioner and as the supervising physician I was immediately available for consultation/collaboration.  Clementeen Graham, MD    Rodolph Bong, MD 01/01/14 [id number]

## 2014-04-03 ENCOUNTER — Emergency Department (HOSPITAL_COMMUNITY): Payer: Medicaid Other

## 2014-04-03 ENCOUNTER — Emergency Department (HOSPITAL_COMMUNITY)
Admission: EM | Admit: 2014-04-03 | Discharge: 2014-04-03 | Disposition: A | Discharge status: Home / Self Care | Payer: Medicaid Other | Attending: Emergency Medicine | Admitting: Emergency Medicine

## 2014-04-03 ENCOUNTER — Encounter (HOSPITAL_COMMUNITY): Payer: Self-pay | Admitting: *Deleted

## 2014-04-03 DIAGNOSIS — Z8781 Personal history of (healed) traumatic fracture: Secondary | ICD-10-CM | POA: Insufficient documentation

## 2014-04-03 DIAGNOSIS — Z720 Tobacco use: Secondary | ICD-10-CM | POA: Insufficient documentation

## 2014-04-03 DIAGNOSIS — Z791 Long term (current) use of non-steroidal anti-inflammatories (NSAID): Secondary | ICD-10-CM | POA: Insufficient documentation

## 2014-04-03 DIAGNOSIS — R0789 Other chest pain: Secondary | ICD-10-CM

## 2014-04-03 LAB — CBC
HEMATOCRIT: 48.6 % (ref 39.0–52.0)
Hemoglobin: 16.4 g/dL (ref 13.0–17.0)
MCH: 31.6 pg (ref 26.0–34.0)
MCHC: 33.7 g/dL (ref 30.0–36.0)
MCV: 93.6 fL (ref 78.0–100.0)
Platelets: 277 10*3/uL (ref 150–400)
RBC: 5.19 MIL/uL (ref 4.22–5.81)
RDW: 12.6 % (ref 11.5–15.5)
WBC: 8 10*3/uL (ref 4.0–10.5)

## 2014-04-03 LAB — BASIC METABOLIC PANEL
Anion gap: 7 (ref 5–15)
BUN: 12 mg/dL (ref 6–23)
CALCIUM: 9.5 mg/dL (ref 8.4–10.5)
CO2: 25 mmol/L (ref 19–32)
Chloride: 106 mEq/L (ref 96–112)
Creatinine, Ser: 0.83 mg/dL (ref 0.50–1.35)
GFR calc Af Amer: >90 mL/min (ref >90)
GFR calc non Af Amer: >90 mL/min (ref >90)
GLUCOSE: 162 mg/dL — AB (ref 70–99)
Potassium: 3.7 mmol/L (ref 3.5–5.1)
SODIUM: 138 mmol/L (ref 135–145)

## 2014-04-03 LAB — I-STAT TROPONIN, ED: Troponin i, poc: 0 ng/mL (ref 0.00–0.08)

## 2014-04-03 MED ORDER — CYCLOBENZAPRINE HCL 10 MG PO TABS
10.0000 mg | ORAL_TABLET | Freq: Once | ORAL | Status: AC
  Administered 2014-04-03: 10 mg via ORAL
  Filled 2014-04-03: qty 1

## 2014-04-03 MED ORDER — CYCLOBENZAPRINE HCL 10 MG PO TABS: 10.0000 mg | ORAL_TABLET | Freq: Three times a day (TID) | ORAL | Status: DC | PRN

## 2014-04-03 NOTE — ED Provider Notes (Signed)
CSN: [id number]     Arrival date & time 04/03/14  1439 History   First MD Initiated Contact with Patient 04/03/14 1703     Chief Complaint  Patient presents with  . Chest Pain     (Consider location/radiation/quality/duration/timing/severity/associated sxs/prior Treatment) The history is provided by the patient.     Patient presents with left sided chest pain that has been constant, waxing and waning, gradually worsening x 6 days.  Pain is exacerbated with talking, eating, coughing, laughing, certain movements, and with palpation.  He has occasional more severe spasms that last 1-2 minutes. Pain is at least 5/10 all the time, dull, with the occasional increases to 8/10 that feels like a muscle spasm. Has taken ibuprofen that initially helped but does not help any longer.  Did try a neighbor's pill (1/4 of a pill) that is a muscle relaxant, states it helped but also made him sick.  Denies injury, fever, change in chronic smoker's cough, SOB, abdominal pain, N/V, hematochezia or melena, leg swelling.  No immediate family members with early MI.    Past Medical History  Diagnosis Date  . Jaw fracture    Past Surgical History  Procedure Laterality Date  . Mandible surgery     History reviewed. No pertinent family history. History  Substance Use Topics  . Smoking status: Current Every Day Smoker -- 1.00 packs/day  . Smokeless tobacco: Not on file  . Alcohol Use: Yes    Review of Systems  All other systems reviewed and are negative.     Allergies  Vicodin  Home Medications   Prior to Admission medications   Medication Sig Start Date End Date Taking? Authorizing Provider  ibuprofen (ADVIL,MOTRIN) 200 MG tablet Take 600-800 mg by mouth every 6 (six) hours as needed for moderate pain.   Yes Historical Provider, MD  methocarbamol (ROBAXIN) 500 MG tablet Take 125 mg by mouth once.   Yes Historical Provider, MD  ciprofloxacin (CIPRO) 500 MG tablet Take 1 tablet (500 mg total) by  mouth every 12 (twelve) hours. Patient not taking: Reported on 04/03/2014 01/18/13   Reuben Likesavid C Keller, MD  clonazePAM (KLONOPIN) 0.5 MG tablet Take 1 tablet (0.5 mg total) by mouth 2 (two) times daily as needed for anxiety. Patient not taking: Reported on 04/03/2014 01/18/13   Reuben Likesavid C Keller, MD  cyclobenzaprine (FLEXERIL) 10 MG tablet Take 1 tablet (10 mg total) by mouth 2 (two) times daily as needed for muscle spasms. Patient not taking: Reported on 04/03/2014 01/21/12   Kathrynn Speedobyn M Hess, PA-C  ibuprofen (ADVIL,MOTRIN) 800 MG tablet Take 1 tablet (800 mg total) by mouth 3 (three) times daily. Patient not taking: Reported on 04/03/2014 01/21/12   Nada Boozerobyn M Hess, PA-C   BP 148/83 mmHg  Pulse 77  Temp(Src) 97.8 F (36.6 C) (Oral)  Resp 16  SpO2 100% Physical Exam  Constitutional: He appears well-developed and well-nourished. No distress.  HENT:  Head: Normocephalic and atraumatic.  Neck: Neck supple.  Cardiovascular: Normal rate and regular rhythm.   Pulmonary/Chest: Effort normal. No respiratory distress. He has no decreased breath sounds. He has wheezes in the right lower field. He has no rhonchi. He has no rales. He exhibits tenderness.    Abdominal: Soft. He exhibits no distension and no mass. There is no tenderness. There is no rebound and no guarding.  Neurological: He is alert. He exhibits normal muscle tone.  Skin: He is not diaphoretic.  Nursing note and vitals reviewed.   ED Course  Procedures (including critical care time) Labs Review Labs Reviewed  BASIC METABOLIC PANEL - Abnormal; Notable for the following:    Glucose, Bld 162 (*)    All other components within normal limits  CBC  I-STAT TROPOININ, ED    Imaging Review Dg Chest 2 View  04/03/2014   CLINICAL DATA:  Left chest pain for 3 days.  No known injury.  EXAM: CHEST  2 VIEW  COMPARISON:  Single view of the chest 12/01/2010.  FINDINGS: Heart size and mediastinal contours are within normal limits. Both lungs are  clear. Visualized skeletal structures are unremarkable.  IMPRESSION: Negative exam.   Electronically Signed   By: Drusilla Kannerhomas  Dalessio M.D.   On: 04/03/2014 17:58     EKG Interpretation   Date/Time:  Sunday April 03 2014 14:47:12 EST Ventricular Rate:  80 PR Interval:  278 QRS Duration: 104 QT Interval:  376 QTC Calculation: 434 R Axis:   75 Text Interpretation:  Sinus rhythm Prolonged PR interval No significant  change was found Confirmed by CAMPOS  MD, KEVIN ([id number]) on 04/03/2014  4:40:59 PM      MDM   Final diagnoses:  Chest wall pain   Afebrile, nontoxic patient with left sided chest pain that has been constant x 6 days.  No exacerbating symptoms.  Appears to be musculoskeletal.  EKG nonischemic.  Labs unremarkable.  CXR negative.   D/C home with flexeril, continued ibuprofen.  Discussed result, findings, treatment, and follow up  with patient.  Pt given return precautions.  Pt verbalizes understanding and agrees with plan.         Trixie Dredgemily Jeremie Giangrande, PA-C 04/03/14 2008  Linwood DibblesJon Knapp, MD 04/05/14 1045

## 2014-04-03 NOTE — ED Notes (Signed)
Pt reports chest pain since Tuesday. Denies n/v/d. Denies SOB. Pain 7/10.

## 2014-04-03 NOTE — Discharge Instructions (Signed)
Read the information below.  Use the prescribed medication as directed.  Please discuss all new medications with your pharmacist.  You may return to the Emergency Department at any time for worsening condition or any new symptoms that concern you.    ° °You have been diagnosed by your caregiver as having chest wall pain. °SEEK IMMEDIATE MEDICAL ATTENTION IF: °You develop a fever.  °Your chest pains become severe or intolerable.  °You develop new, unexplained symptoms (problems).  °You develop shortness of breath, nausea, vomiting, sweating or feel light headed.  °You develop a new cough or you cough up blood. ° ° °Chest Wall Pain °Chest wall pain is pain in or around the bones and muscles of your chest. It may take up to 6 weeks to get better. It may take longer if you must stay physically active in your work and activities.  °CAUSES  °Chest wall pain may happen on its own. However, it may be caused by: °· A viral illness like the flu. °· Injury. °· Coughing. °· Exercise. °· Arthritis. °· Fibromyalgia. °· Shingles. °HOME CARE INSTRUCTIONS  °· Avoid overtiring physical activity. Try not to strain or perform activities that cause pain. This includes any activities using your chest or your abdominal and side muscles, especially if heavy weights are used. °· Put ice on the sore area. °¨ Put ice in a plastic bag. °¨ Place a towel between your skin and the bag. °¨ Leave the ice on for 15-20 minutes per hour while awake for the first 2 days. °· Only take over-the-counter or prescription medicines for pain, discomfort, or fever as directed by your caregiver. °SEEK IMMEDIATE MEDICAL CARE IF:  °· Your pain increases, or you are very uncomfortable. °· You have a fever. °· Your chest pain becomes worse. °· You have new, unexplained symptoms. °· You have nausea or vomiting. °· You feel sweaty or lightheaded. °· You have a cough with phlegm (sputum), or you cough up blood. °MAKE SURE YOU:  °· Understand these  instructions. °· Will watch your condition. °· Will get help right away if you are not doing well or get worse. °Document Released: 03/25/2005 Document Revised: 06/17/2011 Document Reviewed: 11/19/2010 °ExitCare® Patient Information ©2015 [employer]. This information is not intended to replace advice given to you by your health care provider. Make sure you discuss any questions you have with your health care provider. ° ° °Emergency Department Resource Guide °1) Find a Doctor and Pay Out of Pocket °Although you won't have to find out who is covered by your insurance plan, it is a good idea to ask around and get recommendations. You will then need to call the office and see if the doctor you have chosen will accept you as a new patient and what types of options they offer for patients who are self-pay. Some doctors offer discounts or will set up payment plans for their patients who do not have insurance, but you will need to ask so you aren't surprised when you get to your appointment. ° °2) Contact Your Local Health Department °Not all health departments have doctors that can see patients for sick visits, but many do, so it is worth a call to see if yours does. If you don't know where your local health department is, you can check in your phone book. The CDC also has a tool to help you locate your state's health department, and many state websites also have listings of all of their local health departments. ° °  3) Find a Walk-in Clinic °If your illness is not likely to be very severe or complicated, you may want to try a walk in clinic. These are popping up all over the country in pharmacies, drugstores, and shopping centers. They're usually staffed by nurse practitioners or physician assistants that have been trained to treat common illnesses and complaints. They're usually fairly quick and inexpensive. However, if you have serious medical issues or chronic medical problems, these are probably not your best  option. ° °No Primary Care Doctor: °- Call [facility] at  832-8000 - they can help you locate a primary care doctor that  accepts your insurance, provides certain services, etc. °- Physician Referral Service- 1-800-533-3463 ° °Chronic Pain Problems: °Organization         Address  Phone   Notes  °[facility]  (336) 297-2271 Patients need to be referred by their primary care doctor.  ° °Medication Assistance: °Organization         Address  Phone   Notes  °[place] [facility] [address] °[place], [place] [address] (336) 641-8030 --Must be a resident of [place] °-- Must have NO insurance coverage whatsoever (no Medicaid/ Medicare, etc.) °-- The pt. MUST have a primary care doctor that directs their care regularly and follows them in the community °  °[facility]  (866) 331-1348   °[employer]  (888) 892-1162   ° °Agencies that provide inexpensive medical care: °Organization         Address  Phone   Notes  °[facility]  (336) 832-8035   ° Internal Medicine    (336) 832-7272   °[facility] [address] °[place], [place] [address] (336) 832-4777   °[facility] [address], °[place] (336) 271-4999   °[facility]    (336) 373-0678   °[facility]    (336) 272-1050   °[facility] ° [address], [place] Phone:  (336) 832-4444, Fax:  (336) 832-4440 Hours of Operation:  9 am - 6 pm, M-F.  Also accepts Medicaid/Medicare and self-pay.  °[facility] ° [address], [place] Phone: (336) 832-3150, Fax: (336) [phone]. Hours of Operation:  8:30 am - 5:30 pm, M-F.  Also accepts Medicaid and self-pay.  °[facility] [place] [address], [place] Phone: (336) 878-6027   °[facility] [address], [place], [place] (336)723-1848, Ext. 123 Mondays & Thursdays: 7-9 AM.  First 15  patients are seen on a first come, first serve basis. °  ° °Medicaid-accepting [place] Providers: ° °Organization         Address  Phone   Notes  °[facility] [address], [place] (336) 641-2100 Also accepts self-pay patients.  °[facility] [address], [place] ° (336) 856-9996   °[facility] [address], [place] (336) 288-8857   °[facility] [address] [place] [address], [place] (336) 299-7000   °Veita Bland [address],   ° (336) 373-1557 Only accepts [place] Access Medicaid patients after they have their name applied to their card.  ° °Self-Pay (no insurance) in [place]: ° °Organization         Address  Phone   Notes  °Sickle Cell Patients, [facility] [address], [place] (336) 832-1970   °[facility] [address], [place] (336) 832-4400   °[facility] ° [address], [place] (336) 992-4800   °[facility]/Dr. Osei-Bonsu ° [address] [place] [address], [place] or [address], [place] (336) 841-8500 Phone number for both [place] and [place] locations is the same.  °[facility] [address], [place] (336) 299-0000   °[facility] [address] [place] [address], [place] or [address] (336) 852-7530 °(336) 878-2260   °[facility] [address], [place] (336) 350-1642, phone; (336) 294-5005, fax Sees patients 1st and 3rd Saturday of every month.  Must not qualify for public or private insurance (i.e. Medicaid, Medicare, [place] Health Choice, Veterans' Benefits) • Household income should be no more than 200% of the poverty level •The clinic cannot treat you if you are pregnant or think you are pregnant • Sexually transmitted diseases are not treated at the clinic.  ° ° °Dental  Care: °Organization         Address  Phone  Notes  °[facility] [address], [place] (336) 641-6152 Accepts children up to age 21 who are enrolled in Medicaid or [place] Health Choice; pregnant women with a Medicaid card; and children who have applied for Medicaid or [place] Health Choice, but were declined, whose parents can pay a reduced fee at time of service.  °[facility] [place]  [address], [place] (336) 641-7733 Accepts children up to age 21 who are enrolled in Medicaid or [place] Health Choice; pregnant women with a Medicaid card; and children who have applied for Medicaid or [place] Health Choice, but were declined, whose parents can pay a reduced fee at time of service.  °[facility] ° [address], [place] (336) 641-4533 Patients are seen by appointment only. Walk-ins are not accepted. [facility] will see patients 18 years of age and older. °Monday - Tuesday (8am-5pm) °Most Wednesdays (8:30-5pm) °$30 per visit, cash only  °[facility] ° [address], [place] (336) 641-4533 Patients are seen by appointment only. Walk-ins are not accepted. [facility] will see patients 18 years of age and older. °One Wednesday Evening (Monthly: Volunteer Based).  $30 per visit, cash only  °[facility]  (919) 537-3737 for adults; Children under age 4, call Graduate Pediatric Dentistry at ([phone]) [phone]. Children aged 4-14, please call (919) [phone] to request a pediatric application. ° Dental services are provided in all areas of dental care including fillings, crowns and bridges, complete and partial dentures, implants, gum treatment, root canals, and extractions. Preventive care is also provided. Treatment is provided to both adults and children. °Patients are selected via a lottery and there is often a waiting list. °  °[facility] [address], °[place] ° (336) 763-8833 [email or web address] °  °[facility] [address], [place], [place] (336)723-1848, Ext. 123 Second and Fourth Thursday of each month, opens at 6:30 AM; Clinic ends at 9 AM.  Patients are seen on a first-come first-served basis, and a limited number are seen during each clinic.  ° °[facility] ° [address], [place], [place] (336) 723-7904   Eligibility Requirements °You must have lived in [place],   [place], or [place] counties for at least the last three months. °  You cannot be eligible for state or federal sponsored healthcare insurance, including Veterans Administration, Medicaid, or Medicare. °  You generally cannot be eligible for healthcare insurance through your employer.  °  How to apply: °Eligibility screenings are held every Tuesday and Wednesday afternoon from 1:00 pm until 4:00 pm. You do not need an appointment for the interview!  °[facility] [address], [place], [place] 336-631-2330   °[facility]  336-342-8273   °[facility]  336-703-3100   °[facility]  336-570-6415   ° °Behavioral Health Resources in the Community: °Intensive Outpatient Programs °Organization         Address  Phone  Notes  °[facility] [address], [place], [place] 336-878-6098   °[facility] [address], [place], [place] 336-832-9800   °[facility] [address], [place], [place] ° 336-882-2125   °[facility] [address],  °[place], [place] 1-800-853-5163 or 336-641-4981   °Substance Abuse Resources °Organization         Address  Phone  Notes  °[facility]  336-882-2125   °[facility]  336-784-9470   °[facility]  336-285-9073   °[facility]  336-845-3988   °Residential & Outpatient Substance Abuse Program  1-800-659-3381    °Psychological Services °Organization         Address  Phone  Notes  °[facility]  336- 832-9600   °[facility]  336- 378-7881   °[facility] [address], [place] 1-800-853-5163 or 336-641-4981   ° °Mobile Crisis Teams °Organization         Address  Phone  Notes  °[facility], Mobile Crisis Care Unit  1-877-626-1772   °Assertive °[facility] ° [address] [place], [place] 336-834-9664   °Sharon DeEsch [address] °[place] 336-554-5454   ° °Self-Help/Support Groups °Organization         Address  Phone             Notes  °[facility] - variety of support groups  336- 373-1402 Call for more information  °Narcotics Anonymous (NA), [facility] [address], °[place]  2 meetings at this location  ° °Residential Treatment Programs °Organization         Address  Phone  Notes  °[facility] [address],    °[place]  1-866-801-8205   °[facility] ° [address], [place], [place] 704-293-8524   °[facility] [address], [place] 336-845-3988 Admissions: 8am-3pm M-F  °[facility] [address]    °[place], [place] 336-841-1104   °[facility] [address], [place], [place] 336-379-7146   °[facility] [address]  °[place], [place] 336-285-9073   °[facility] - Intensive Outpatient [address], [place], [place] 336-852-3033   °[facility] [address]  °[place], [place] 1-877-615-2722 or 336-784-9470   °[facility] [address] [place],  336-227-7417 Accepts Medicaid  °[facility] [address]  °[place]  1-800-659-3381 Substance Abuse/Addiction Treatment  ° °[place] Behavioral Health Resources °Organization         Address  Phone  Notes  °[facility]  (888) 581-9988   °Julie Brannon, PhD [address] [place], [place]   (336) 349-5553 or (336) 951-0000   °[facility]   [address] °[place], [place] (336) 349-4454   °[facility] [address], [place], [place] (336) 342-8316 Insurance/Medicaid/sponsorship through [facility]  °[facility] [address]                                    [place], [place] (336) 342-8316 Therapy/tele-psych/case  °[facility] [address]  ° [place], [place] (336) 349-2233    °Dr. Arfeen  (336) 349-4544   °[facility] [place]  [employer] [place] [facility] 1) [address], [place] °2) [address], [place] °3)  [address], [place] (336) 349-3220 °(336) 342-7768 ° °(336) 342-8140   °[place] [facility] (336) 342-1394 or (336) 342-3537 (After Hours)    ° ° ° ° °

## 2015-11-07 ENCOUNTER — Emergency Department (HOSPITAL_COMMUNITY)
Admission: EM | Admit: 2015-11-07 | Discharge: 2015-11-07 | Disposition: A | Discharge status: Home / Self Care | Payer: Medicaid Other | Attending: Emergency Medicine | Admitting: Emergency Medicine

## 2015-11-07 ENCOUNTER — Emergency Department (HOSPITAL_COMMUNITY): Payer: Medicaid Other

## 2015-11-07 ENCOUNTER — Encounter (HOSPITAL_COMMUNITY): Payer: Self-pay | Admitting: *Deleted

## 2015-11-07 DIAGNOSIS — R1030 Lower abdominal pain, unspecified: Secondary | ICD-10-CM | POA: Insufficient documentation

## 2015-11-07 DIAGNOSIS — M549 Dorsalgia, unspecified: Secondary | ICD-10-CM

## 2015-11-07 DIAGNOSIS — N50812 Left testicular pain: Secondary | ICD-10-CM | POA: Diagnosis not present

## 2015-11-07 DIAGNOSIS — F17210 Nicotine dependence, cigarettes, uncomplicated: Secondary | ICD-10-CM | POA: Insufficient documentation

## 2015-11-07 DIAGNOSIS — N50811 Right testicular pain: Secondary | ICD-10-CM | POA: Insufficient documentation

## 2015-11-07 DIAGNOSIS — N451 Epididymitis: Secondary | ICD-10-CM | POA: Diagnosis not present

## 2015-11-07 DIAGNOSIS — N50819 Testicular pain, unspecified: Secondary | ICD-10-CM

## 2015-11-07 DIAGNOSIS — M545 Low back pain: Secondary | ICD-10-CM | POA: Insufficient documentation

## 2015-11-07 LAB — URINALYSIS, ROUTINE W REFLEX MICROSCOPIC
Bilirubin Urine: NEGATIVE
Glucose, UA: NEGATIVE mg/dL
HGB URINE DIPSTICK: NEGATIVE
Ketones, ur: NEGATIVE mg/dL
Leukocytes, UA: NEGATIVE
Nitrite: NEGATIVE
PROTEIN: NEGATIVE mg/dL
Specific Gravity, Urine: 1.024 (ref 1.005–1.030)
pH: 7 (ref 5.0–8.0)

## 2015-11-07 MED ORDER — OXYCODONE-ACETAMINOPHEN 5-325 MG PO TABS
1.0000 | ORAL_TABLET | Freq: Once | ORAL | Status: AC
  Administered 2015-11-07: 1 via ORAL
  Filled 2015-11-07: qty 1

## 2015-11-07 MED ORDER — DOXYCYCLINE HYCLATE 100 MG PO TABS
100.0000 mg | ORAL_TABLET | Freq: Once | ORAL | Status: AC
  Administered 2015-11-07: 100 mg via ORAL
  Filled 2015-11-07: qty 1

## 2015-11-07 MED ORDER — ONDANSETRON 4 MG PO TBDP
4.0000 mg | ORAL_TABLET | Freq: Once | ORAL | Status: AC
  Administered 2015-11-07: 4 mg via ORAL
  Filled 2015-11-07: qty 1

## 2015-11-07 MED ORDER — METHYLPREDNISOLONE 4 MG PO TBPK: ORAL_TABLET | ORAL | 0 refills | Status: DC

## 2015-11-07 MED ORDER — IBUPROFEN 800 MG PO TABS: 800.0000 mg | ORAL_TABLET | Freq: Three times a day (TID) | ORAL | 0 refills | Status: AC

## 2015-11-07 MED ORDER — LIDOCAINE HCL 1 % IJ SOLN
INTRAMUSCULAR | Status: AC
  Administered 2015-11-07: 2.1 mL
  Filled 2015-11-07: qty 20

## 2015-11-07 MED ORDER — CEFTRIAXONE SODIUM 250 MG IJ SOLR
250.0000 mg | Freq: Once | INTRAMUSCULAR | Status: AC
  Administered 2015-11-07: 250 mg via INTRAMUSCULAR
  Filled 2015-11-07: qty 250

## 2015-11-07 MED ORDER — DOXYCYCLINE HYCLATE 100 MG PO CAPS: 100.0000 mg | ORAL_CAPSULE | Freq: Two times a day (BID) | ORAL | 0 refills | Status: DC

## 2015-11-07 NOTE — Discharge Instructions (Signed)
Follow-up with your doctor.  Return to the ED if you develop new or worsening symptoms. °
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
## 2015-11-07 NOTE — ED Notes (Signed)
US at bedside

## 2015-11-07 NOTE — Progress Notes (Signed)
Patient listed as having Medicaid insurance without a pcp.  Pcp listed on patient's insurance card is located at [facility]. EDCM went to speak to patient at bedside, however, [place] currently at bedside.

## 2015-11-07 NOTE — ED Triage Notes (Signed)
Pt complains of back pain that radiates to his testicles since yesterday. Pt states he has had back pain for the past 2 months, but states he came concerned when the pain started radiating to his testicles. Pt had not noticed blood in urine.

## 2015-11-24 ENCOUNTER — Ambulatory Visit: Payer: Medicaid Other | Admitting: Family Medicine

## 2015-11-28 ENCOUNTER — Ambulatory Visit (HOSPITAL_COMMUNITY)
Admission: EM | Admit: 2015-11-28 | Discharge: 2015-11-28 | Disposition: A | Discharge status: Home / Self Care | Payer: Medicaid Other | Attending: Family Medicine | Admitting: Family Medicine

## 2015-11-28 ENCOUNTER — Encounter (HOSPITAL_COMMUNITY): Payer: Self-pay | Admitting: Emergency Medicine

## 2015-11-28 DIAGNOSIS — R1030 Lower abdominal pain, unspecified: Secondary | ICD-10-CM | POA: Diagnosis not present

## 2015-11-28 DIAGNOSIS — S39011A Strain of muscle, fascia and tendon of abdomen, initial encounter: Secondary | ICD-10-CM

## 2015-11-28 DIAGNOSIS — K5900 Constipation, unspecified: Secondary | ICD-10-CM

## 2015-11-28 MED ORDER — POLYETHYLENE GLYCOL 3350 17 G PO PACK: 17.0000 g | PACK | Freq: Every day | ORAL | 0 refills | Status: DC

## 2015-11-28 NOTE — ED Triage Notes (Signed)
[facility] a [facility] of RUQ abdominal pain that started today.
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
## 2016-08-14 ENCOUNTER — Encounter (HOSPITAL_COMMUNITY): Payer: Self-pay | Admitting: Family Medicine

## 2016-08-14 ENCOUNTER — Ambulatory Visit (HOSPITAL_COMMUNITY)
Admission: EM | Admit: 2016-08-14 | Discharge: 2016-08-14 | Disposition: A | Discharge status: Home / Self Care | Payer: Medicaid Other | Attending: Internal Medicine | Admitting: Internal Medicine

## 2016-08-14 DIAGNOSIS — K2960 Other gastritis without bleeding: Secondary | ICD-10-CM | POA: Diagnosis not present

## 2016-08-14 LAB — POCT I-STAT, CHEM 8
BUN: 9 mg/dL (ref 6–20)
CREATININE: 0.9 mg/dL (ref 0.61–1.24)
Calcium, Ion: 1.23 mmol/L (ref 1.15–1.40)
Chloride: 103 mmol/L (ref 101–111)
Glucose, Bld: 106 mg/dL — ABNORMAL HIGH (ref 65–99)
HCT: 53 % — ABNORMAL HIGH (ref 39.0–52.0)
HEMOGLOBIN: 18 g/dL — AB (ref 13.0–17.0)
Potassium: 3.8 mmol/L (ref 3.5–5.1)
Sodium: 141 mmol/L (ref 135–145)
TCO2: 25 mmol/L (ref 0–100)

## 2016-08-14 MED ORDER — OMEPRAZOLE 40 MG PO CPDR: 40.0000 mg | DELAYED_RELEASE_CAPSULE | Freq: Two times a day (BID) | ORAL | 0 refills | Status: AC

## 2016-08-14 NOTE — ED Triage Notes (Signed)
Pt here for LUQ pain, acid reflux and black stool since Saturday. sts burning pain. sts also some right flank pain,.

## 2016-08-14 NOTE — ED Provider Notes (Signed)
[facility]    CSN: [id number] Arrival date & time: 08/14/16  1204     History   Chief Complaint Chief Complaint  Patient presents with  . Abdominal Pain    HPI Keith [name] is a 46 y.o. male. 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 He is under a lot of stress. He has upper left abdominal discomfort, kind of a burning sensation, some dry heaving. Has been taking Pepto-Bismol and Advil/Aleve. Mornings are the worst. Has a lot of burping. In the last 4 days has had a lot of black stools. Not dizzy, not lightheaded. Had prostatitis last year, never had this followed up and is a little worried about it.    HPI  Past Medical History:  Diagnosis Date  . Jaw fracture [facility])      Past Surgical History:  Procedure Laterality Date  . MANDIBLE SURGERY         Home Medications    Prior to Admission medications   Medication Sig Start Date End Date Taking? Authorizing Provider  ibuprofen (ADVIL,MOTRIN) 800 MG tablet Take 1 tablet (800 mg total) by mouth 3 (three) times daily. 11/07/15   Rancour, Jeannett Senior, MD  omeprazole (PRILOSEC) 40 MG capsule Take 1 capsule (40 mg total) by mouth 2 (two) times daily before a meal. 08/14/16 08/29/16  Eustace Moore, MD    Family History History reviewed. No pertinent family history.  Social History Social History  Substance Use Topics  . Smoking status: Current Every Day Smoker    Packs/day: 1.00  . Smokeless tobacco: Never Used  . Alcohol use Yes     Allergies   Vicodin [hydrocodone-acetaminophen]   Review of Systems Review of Systems  All other systems reviewed and are negative.    Physical Exam Triage Vital Signs ED Triage Vitals  Enc Vitals Group     BP 08/14/16 1303 (!) 158/91     Pulse Rate 08/14/16 1303 84     Resp 08/14/16 1303 16     Temp 08/14/16 1303 98.1 F (36.7 C)     Temp src --      SpO2 08/14/16 1303 100 %     Weight --      Height --      Pain Score 08/14/16 1302 4     Pain Loc --    Updated  Vital Signs BP (!) 142/93   Pulse 79   Temp 98.1 F (36.7 C)   Resp 16   SpO2 100%   Physical Exam  Constitutional: He is oriented to person, place, and time. No distress.  Alert, nicely groomed  HENT:  Head: Atraumatic.  Eyes:  Conjugate gaze, no eye redness/drainage  Neck: Neck supple.  Cardiovascular: Normal rate and regular rhythm.   Pulmonary/Chest: No respiratory distress. He has no wheezes. He has no rales.  Coarse but symmetric breath sounds throughout  Abdominal: Soft. He exhibits no distension. There is no rebound and no guarding.  Moderate tenderness to deep palpation in the epigastrium and especially in the left upper quadrant  Musculoskeletal: Normal range of motion.  Neurological: He is alert and oriented to person, place, and time.  Skin: Skin is warm and dry.  No cyanosis  Nursing note and vitals reviewed.    UC Treatments / Results   Procedures Procedures (including critical care time) None today  Final Clinical Impressions(s) / UC Diagnoses   Final diagnoses:  Reflux gastritis   Labs today at urgent care did not  show blood loss anemia.  Black stools may be due to pepto bismol.  Prescription for [facility]eprazole [facility] to [facility] [address] [facility] [address].  [facility] to establish care and arrange further evaluation, if needed, for stomach pain and prostatitis.    New Prescriptions Discharge Medication List as of 08/14/2016  3:48 PM    START taking these medications   Details  omeprazole (PRILOSEC) 40 MG capsule Take 1 capsule (40 mg total) by mouth 2 (two) times daily before a meal., Starting Wed 08/14/2016, Until Thu 08/29/2016, Normal         Eustace MooreMurray, Zyriah Mask W, MD 08/18/16 [phone]

## 2016-08-14 NOTE — Discharge Instructions (Addendum)
Labs today at urgent care did not show blood loss anemia.  Black stools may be due to pepto bismol.  Prescription for [facility]ole [facility] [address] [facility] [address].  [facility] to establish care and arrange further evaluation, if needed, for stomach pain and prostatitis.

## 2016-08-14 NOTE — ED Notes (Signed)
Pt needed to leave to go get his younger daughter from school.  Pt states he will be back in about 30 min.

## 2017-02-04 ENCOUNTER — Emergency Department (HOSPITAL_COMMUNITY): Payer: Medicaid Other

## 2017-02-04 ENCOUNTER — Encounter (HOSPITAL_COMMUNITY): Payer: Self-pay

## 2017-02-04 DIAGNOSIS — Z5321 Procedure and treatment not carried out due to patient leaving prior to being seen by health care provider: Secondary | ICD-10-CM | POA: Insufficient documentation

## 2017-02-04 DIAGNOSIS — R079 Chest pain, unspecified: Secondary | ICD-10-CM | POA: Insufficient documentation

## 2017-02-04 LAB — CBC
HEMATOCRIT: 46.5 % (ref 39.0–52.0)
Hemoglobin: 16.4 g/dL (ref 13.0–17.0)
MCH: 32.5 pg (ref 26.0–34.0)
MCHC: 35.3 g/dL (ref 30.0–36.0)
MCV: 92.1 fL (ref 78.0–100.0)
PLATELETS: 280 10*3/uL (ref 150–400)
RBC: 5.05 MIL/uL (ref 4.22–5.81)
RDW: 12.4 % (ref 11.5–15.5)
WBC: 6.3 10*3/uL (ref 4.0–10.5)

## 2017-02-04 LAB — BASIC METABOLIC PANEL
Anion gap: 11 (ref 5–15)
BUN: 8 mg/dL (ref 6–20)
CHLORIDE: 104 mmol/L (ref 101–111)
CO2: 21 mmol/L — AB (ref 22–32)
CREATININE: 0.83 mg/dL (ref 0.61–1.24)
Calcium: 9 mg/dL (ref 8.9–10.3)
GFR calc Af Amer: >60 mL/min (ref >60)
GFR calc non Af Amer: >60 mL/min (ref >60)
Glucose, Bld: 102 mg/dL — ABNORMAL HIGH (ref 65–99)
POTASSIUM: 3.4 mmol/L — AB (ref 3.5–5.1)
Sodium: 136 mmol/L (ref 135–145)

## 2017-02-04 LAB — POCT I-STAT TROPONIN I: Troponin i, poc: 0.01 ng/mL (ref 0.00–0.08)

## 2017-02-04 NOTE — ED Triage Notes (Signed)
Pt complains of left sided chest pain since Sunday, he thought it was reflux, but the pain wouldn't go away Pt has no other complaints

## 2017-02-05 ENCOUNTER — Emergency Department (HOSPITAL_COMMUNITY)
Admission: EM | Admit: 2017-02-05 | Discharge: 2017-02-05 | Disposition: A | Discharge status: Home / Self Care | Payer: Medicaid Other | Attending: Emergency Medicine | Admitting: Emergency Medicine

## 2017-02-05 NOTE — ED Notes (Signed)
Pt called from triage, no answer 

## 2017-05-16 IMAGING — US US SCROTUM
1 series · 13 of 25 positions shown · non-contrast
Comparison: None.

CLINICAL DATA: 44-year-old male with left side testicular pain for
1 day. Initial encounter.

EXAM:
SCROTAL ULTRASOUND
DOPPLER ULTRASOUND OF THE TESTICLES
TECHNIQUE: Complete ultrasound examination of the testicles, epididymis, and
other scrotal structures was performed. Color and spectral Doppler
ultrasound were also utilized to evaluate blood flow to the
testicles.

[Series 1: us scrotum · 0.06mm/px · 13 of 61 slices shown]
[im 1/61]
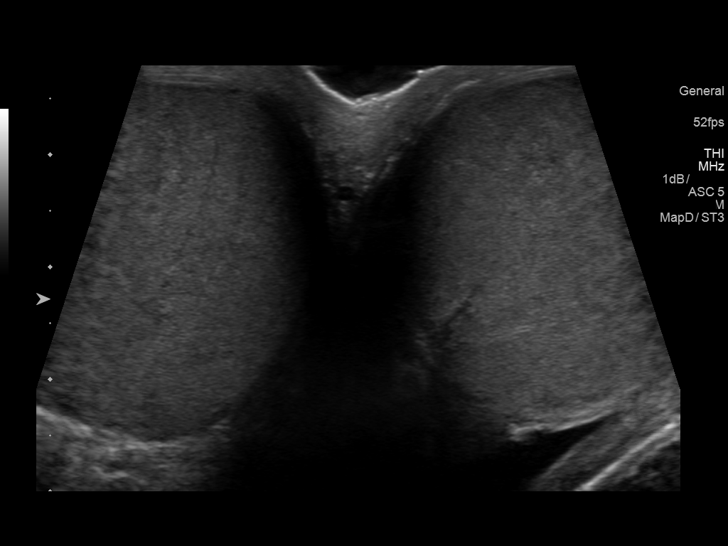
[im 6/61]
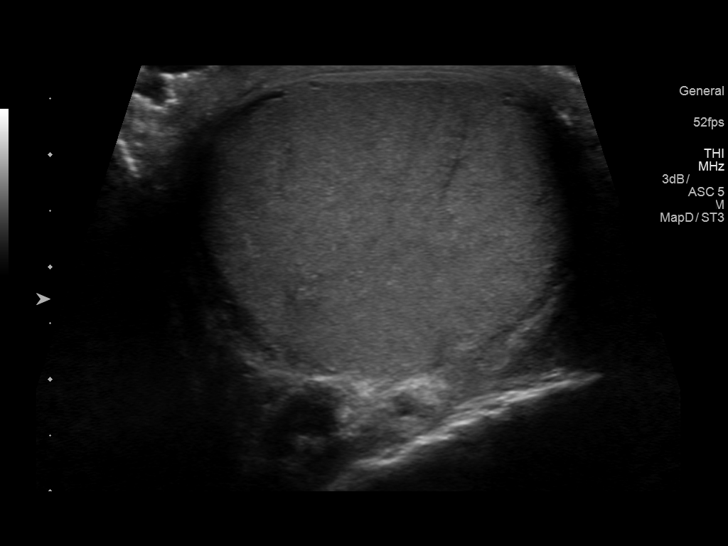
[im 11/61]
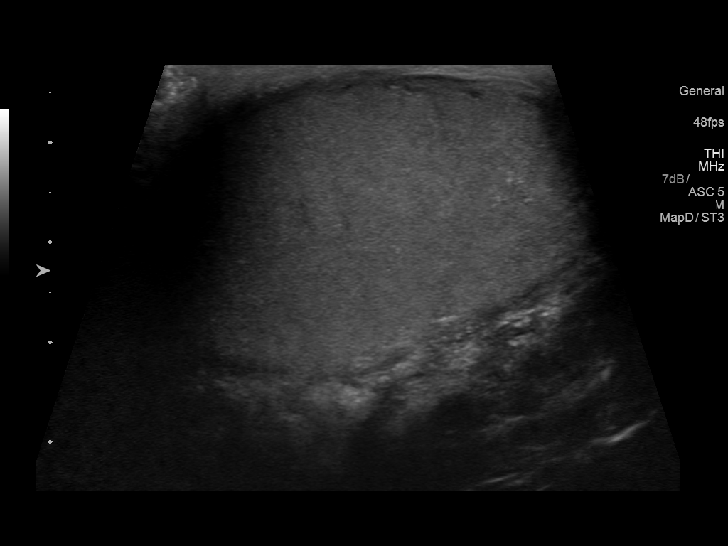
[im 16/61]
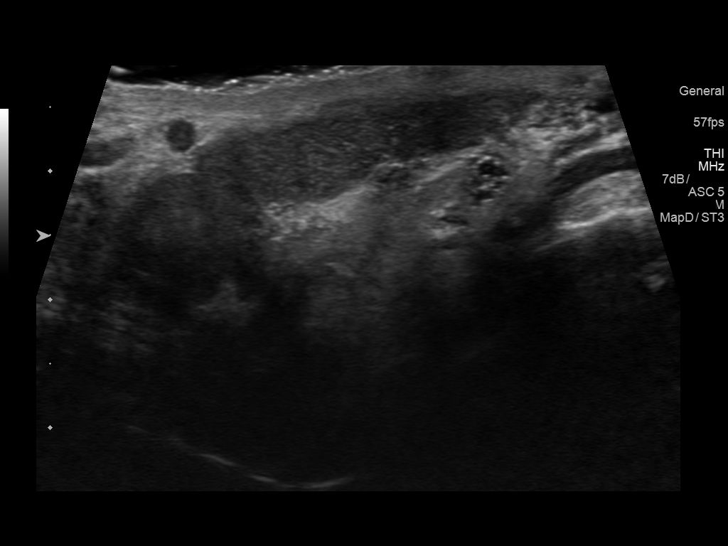
[im 21/61]
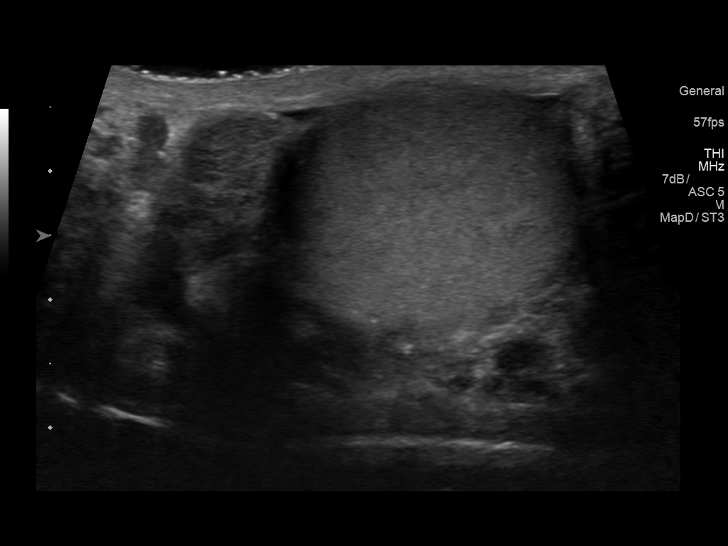
[im 26/61]
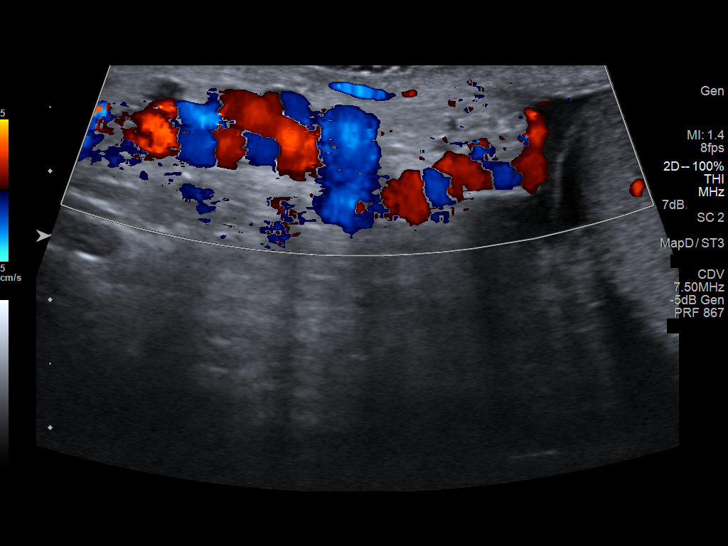
[im 31/61]
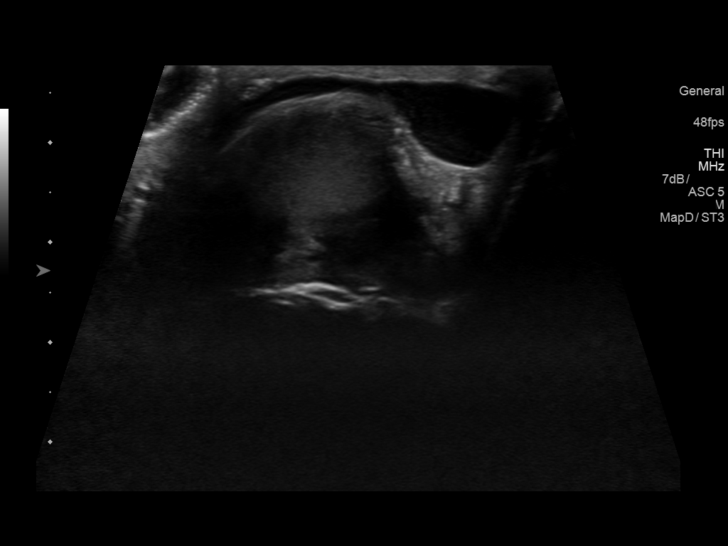
[im 36/61]
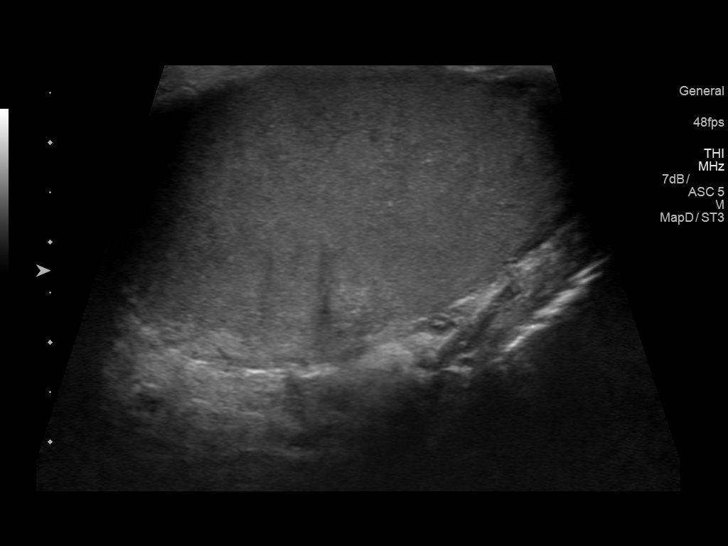
[im 41/61]
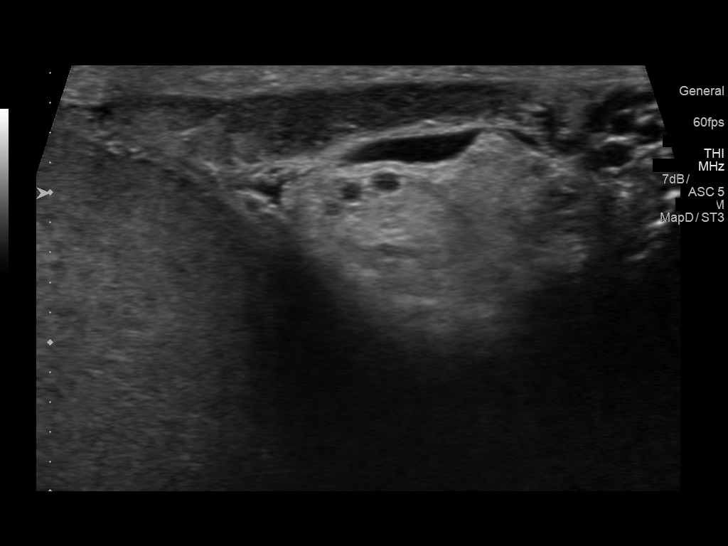
[im 46/61]
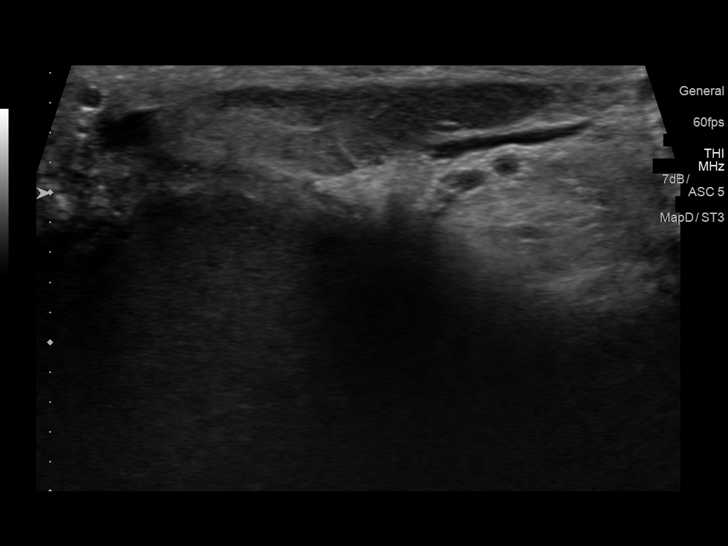
[im 51/61]
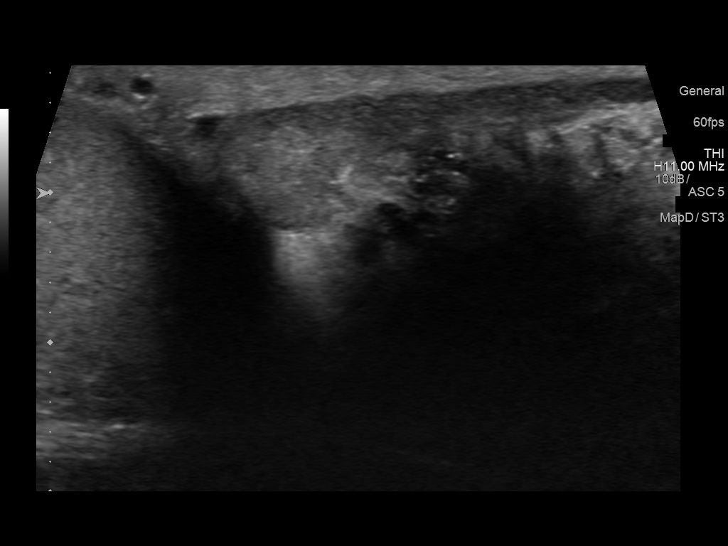
[im 56/61]
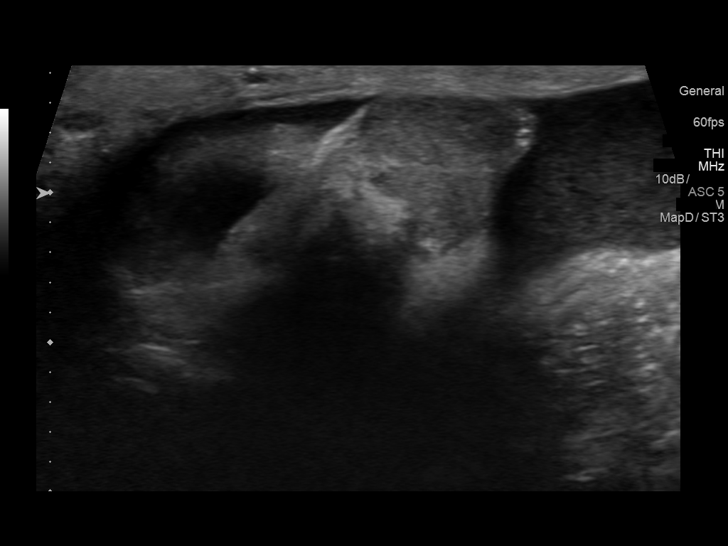
[im 61/61]
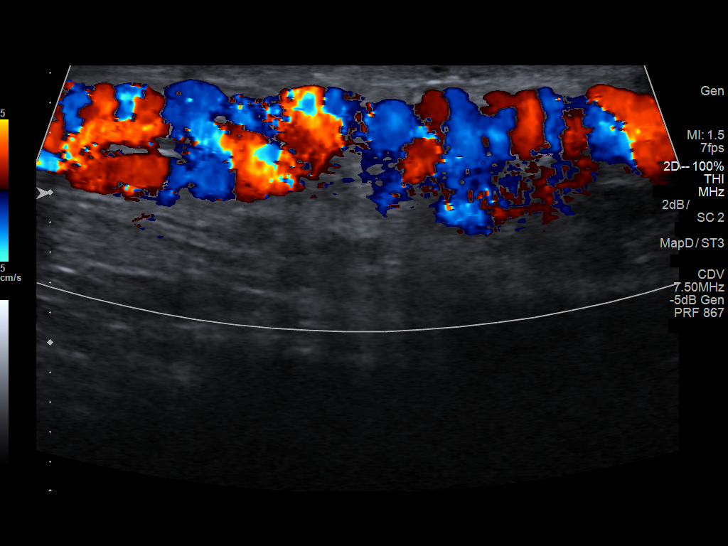

[13 of 25 positions shown; findings below may reference images not displayed]

FINDINGS: Right testicle

Measurements: 5.1 x 3.3 x 3.5 cm. No mass or microlithiasis
visualized.

Left testicle

Measurements: 5.1 x 3.3 x 3.6 cm. No mass or microlithiasis
visualized.

Right epididymis:  Normal in size and appearance.

Left epididymis: Mildly enlarged, heterogeneous and perhaps mildly
hypervascular epididymal head (image 52).

Hydrocele:  Small fairly simple appearing left-sided hydrocele.

Varicocele: Evidence of bilateral varicoceles (left side image 66
and right side image 29).

Pulsed Doppler interrogation of both testes demonstrates normal low
resistance arterial and venous waveforms bilaterally.
IMPRESSION: 1. Negative for testicular torsion.
2. Mildly enlarged and heterogeneous left epididymal head raising
the possibility of acute infectious left epididymitis.
3. Bilateral varicoceles.

## 2017-11-19 ENCOUNTER — Encounter (HOSPITAL_COMMUNITY): Payer: Self-pay | Admitting: Emergency Medicine

## 2017-11-19 ENCOUNTER — Emergency Department (HOSPITAL_COMMUNITY)
Admission: EM | Admit: 2017-11-19 | Discharge: 2017-11-19 | Disposition: A | Discharge status: Home / Self Care | Payer: Medicaid Other | Attending: Emergency Medicine | Admitting: Emergency Medicine

## 2017-11-19 ENCOUNTER — Other Ambulatory Visit: Payer: Self-pay

## 2017-11-19 DIAGNOSIS — R200 Anesthesia of skin: Secondary | ICD-10-CM | POA: Insufficient documentation

## 2017-11-19 DIAGNOSIS — Z5321 Procedure and treatment not carried out due to patient leaving prior to being seen by health care provider: Secondary | ICD-10-CM | POA: Diagnosis not present

## 2017-11-19 NOTE — ED Notes (Signed)
edp aware of pt. No other orders received at this time.

## 2017-11-19 NOTE — ED Notes (Signed)
Called pt back for room placement, no answer.
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
## 2017-11-19 NOTE — ED Notes (Signed)
Pt not in lobby for room placement.

## 2017-11-21 NOTE — ED Notes (Signed)
Follow up call made  No answer  11/21/17 1038  s Jennings Corado rn

## 2017-11-27 ENCOUNTER — Other Ambulatory Visit: Payer: Self-pay | Admitting: Physician Assistant

## 2017-11-27 DIAGNOSIS — N50811 Right testicular pain: Secondary | ICD-10-CM

## 2017-11-29 ENCOUNTER — Other Ambulatory Visit: Payer: Self-pay

## 2017-11-29 ENCOUNTER — Emergency Department (HOSPITAL_COMMUNITY)
Admission: EM | Admit: 2017-11-29 | Discharge: 2017-11-29 | Disposition: A | Discharge status: Home / Self Care | Payer: Medicaid Other | Attending: Emergency Medicine | Admitting: Emergency Medicine

## 2017-11-29 ENCOUNTER — Encounter (HOSPITAL_COMMUNITY): Payer: Self-pay | Admitting: Emergency Medicine

## 2017-11-29 DIAGNOSIS — T7840XA Allergy, unspecified, initial encounter: Secondary | ICD-10-CM | POA: Insufficient documentation

## 2017-11-29 DIAGNOSIS — F17200 Nicotine dependence, unspecified, uncomplicated: Secondary | ICD-10-CM | POA: Insufficient documentation

## 2017-11-29 DIAGNOSIS — W57XXXA Bitten or stung by nonvenomous insect and other nonvenomous arthropods, initial encounter: Secondary | ICD-10-CM | POA: Diagnosis not present

## 2017-11-29 DIAGNOSIS — Z79899 Other long term (current) drug therapy: Secondary | ICD-10-CM | POA: Insufficient documentation

## 2017-11-29 DIAGNOSIS — R21 Rash and other nonspecific skin eruption: Secondary | ICD-10-CM | POA: Diagnosis present

## 2017-11-29 MED ORDER — HYDROCORTISONE 1 % EX CREA: TOPICAL_CREAM | CUTANEOUS | 0 refills | Status: AC

## 2017-11-29 NOTE — ED Provider Notes (Signed)
[facility]-EMERGENCY DEPT Provider Note   CSN: [id number] Arrival date & time: 11/29/17  0106     History   Chief Complaint Chief Complaint  Patient presents with  . Insect Bite    HPI Keith [name] is a 47 y.o. male.  HPI 47 year old male comes in with chief complaint of rash. Patient reports that around 2 PM he was at work when he felt a sharp pain in the back of his leg.  Few minutes later he checked the back of his leg and noted bite type mark with purple rash surrounding it.  Once he got home we recheck the wound and he noted that there was significant more redness.  Patient had burning type pain initially, over time he does not have any significant pain but has noted cramping intermittently in his lower extremity.  Patient denies any weakness in his legs.  Past Medical History:  Diagnosis Date  . Jaw fracture (HCC)     There are no active problems to display for this patient.   Past Surgical History:  Procedure Laterality Date  . MANDIBLE SURGERY          Home Medications    Prior to Admission medications   Medication Sig Start Date End Date Taking? Authorizing Provider  hydrocortisone cream 1 % Apply to affected area 2 times daily 11/29/17   Derwood KaplanNanavati, Finley Dinkel, MD  ibuprofen (ADVIL,MOTRIN) 800 MG tablet Take 1 tablet (800 mg total) by mouth 3 (three) times daily. 11/07/15   Rancour, Jeannett SeniorStephen, MD  omeprazole (PRILOSEC) 40 MG capsule Take 1 capsule (40 mg total) by mouth 2 (two) times daily before a meal. 08/14/16 08/29/16  Isa RankinMurray, Laura Wilson, MD    Family History No family history on file.  Social History Social History   Tobacco Use  . Smoking status: Current Every Day Smoker    Packs/day: 1.00  . Smokeless tobacco: Never Used  Substance Use Topics  . Alcohol use: Yes    Comment: 5x a week/beer  . Drug use: Yes    Types: Marijuana    Comment: today     Allergies   Vicodin [hydrocodone-acetaminophen]   Review of  Systems Review of Systems  Constitutional: Positive for activity change.  Respiratory: Negative for shortness of breath and wheezing.   Cardiovascular: Negative for chest pain.  Gastrointestinal: Negative for abdominal pain and nausea.  Skin: Positive for rash.  Allergic/Immunologic: Negative for environmental allergies, food allergies and immunocompromised state.  Hematological: Does not bruise/bleed easily.     Physical Exam Updated Vital Signs BP 122/73 (BP Location: Right Arm)   Pulse 76   Temp 97.9 F (36.6 C) (Oral)   Resp 16   SpO2 99%   Physical Exam  Constitutional: He is oriented to person, place, and time. He appears well-developed.  HENT:  Head: Normocephalic and atraumatic.  Eyes: Pupils are equal, round, and reactive to light. Conjunctivae and EOM are normal.  Neck: Normal range of motion. Neck supple.  Cardiovascular: Normal rate and regular rhythm.  Pulmonary/Chest: Effort normal and breath sounds normal.  Abdominal: Soft. Bowel sounds are normal. He exhibits no distension. There is no tenderness. There is no guarding.  Neurological: He is alert and oriented to person, place, and time.  Skin: Skin is warm. Rash noted.  Patient has about 8 cm lesion noted behind his left knee.  Over the popliteal fossa there is edema and ecchymosis.   Nursing note and vitals reviewed.    ED Treatments /  Results  Labs (all labs ordered are listed, but only abnormal results are displayed) Labs Reviewed - No data to display  EKG None  Radiology No results found.  Procedures Procedures (including critical care time)     Medications Ordered in ED Medications - No data to display   Initial Impression / Assessment and Plan / ED Course  I have reviewed the triage vital signs and the nursing notes.  Pertinent labs & imaging results that were available during my care of the patient were reviewed by me and considered in my medical decision making (see chart for  details).     47 year old male comes in with chief complaint of pain and swelling behind his left knee.  In the popliteal region patient does have some type of hypersensitivity reaction that is localized.  It seems like there could have been neurotoxin least, leading to the burning type pain and cramping.  There also appears to be mild ecchymosis in the side.  The onset of symptoms was more than 4 hours ago, patient unlikely to have progression.  He declines oral steroids, but would appreciate topical steroids - which will be prescribed.  Strict ER return precautions have been discussed with the patient.  Final Clinical Impressions(s) / ED Diagnoses   Final diagnoses:  Hypersensitivity reaction, initial encounter    ED Discharge Orders         Ordered    hydrocortisone cream 1 %     11/29/17 0445           Derwood Kaplan, MD 11/29/17 [phone]

## 2017-11-29 NOTE — Discharge Instructions (Signed)
It appears that you are having a localized hypersensitivity reaction. We are unsure what caused you to have this reaction. Apply the ointment that has been prescribed.  Return to the ER if the rash is spreading significantly or you start any numbness or tingling in your legs or weakness in your legs.

## 2017-11-29 NOTE — ED Triage Notes (Signed)
Pt arriving with insect bit behind his left knee. Pt reports the bite occurred around 2pm. Area is purple and red. Pt reports the swelling has increased over time.

## 2017-12-02 ENCOUNTER — Ambulatory Visit
Admission: RE | Admit: 2017-12-02 | Discharge: 2017-12-02 | Disposition: A | Discharge status: Home / Self Care | Payer: Medicaid Other | Source: Ambulatory Visit | Attending: Physician Assistant | Admitting: Physician Assistant

## 2017-12-02 DIAGNOSIS — N50811 Right testicular pain: Secondary | ICD-10-CM

## 2018-02-02 IMAGING — CT CT RENAL STONE PROTOCOL
2 of 3 series · 15 of 46 positions shown, 17 images · non-contrast
Comparison: None.

CLINICAL DATA: Back pain radiating to testicles since yesterday.
Blood in urine.

EXAM:
CT ABDOMEN AND PELVIS WITHOUT CONTRAST
TECHNIQUE: Multidetector CT imaging of the abdomen and pelvis was performed
following the standard protocol without IV contrast.

[Series 3: coronal · coronal · 0.88mm/px · 3 of 163 slices shown]
[im 55/163  soft-tissue]
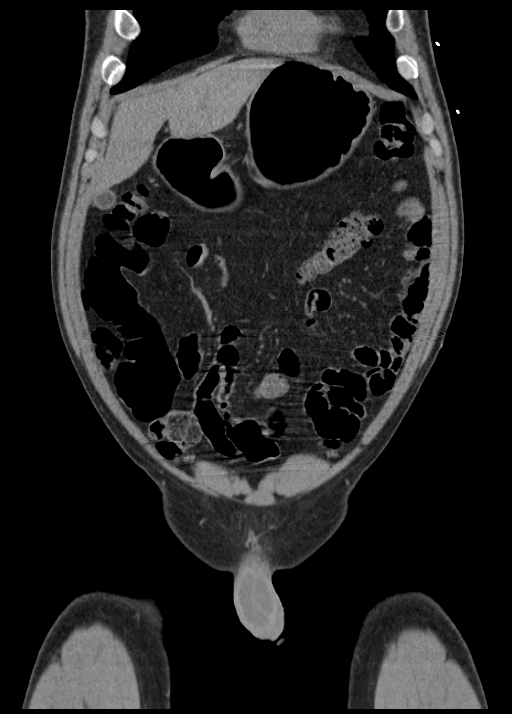
[im 73/163  soft-tissue]
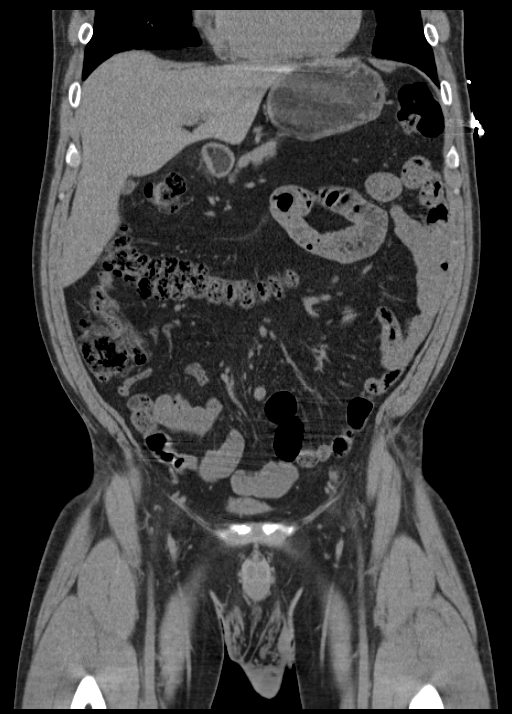
[im 91/163  soft-tissue]
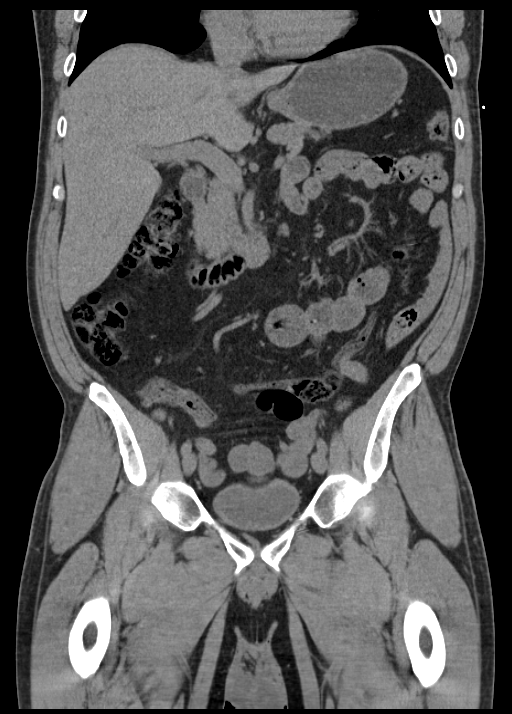

[Series 6: lung · axial · 0.88mm/px · z∈[-56,+54]mm · 12 of 65 slices shown, 14 images]
[im 5/65  soft-tissue]
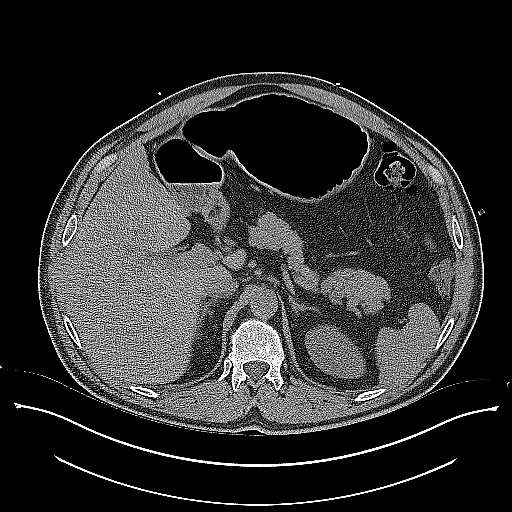
[im 5/65  bone]
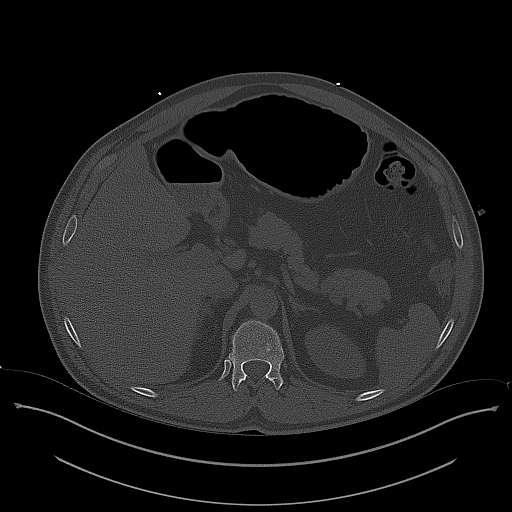
[im 9/65  soft-tissue]
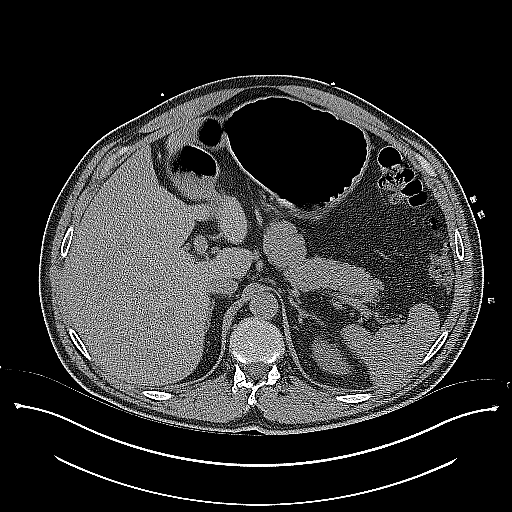
[im 15/65  soft-tissue]
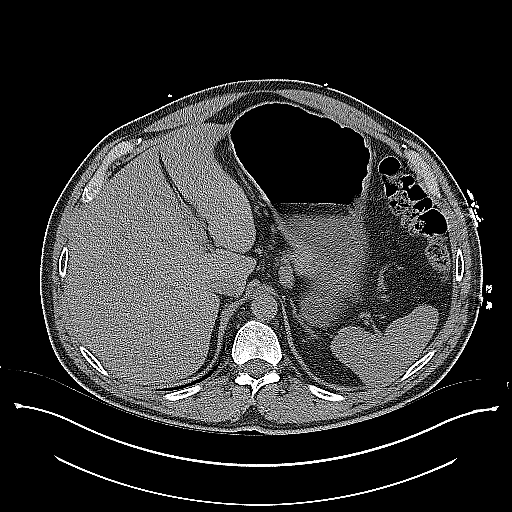
[im 19/65  soft-tissue]
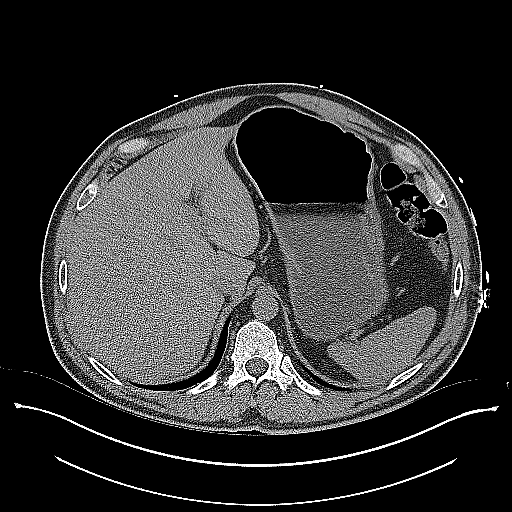
[im 25/65  soft-tissue]
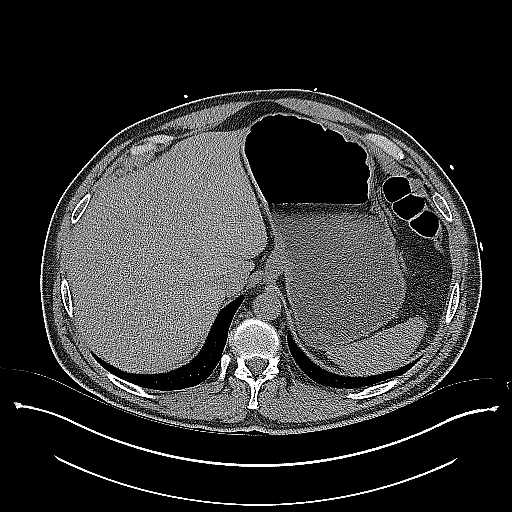
[im 29/65  soft-tissue]
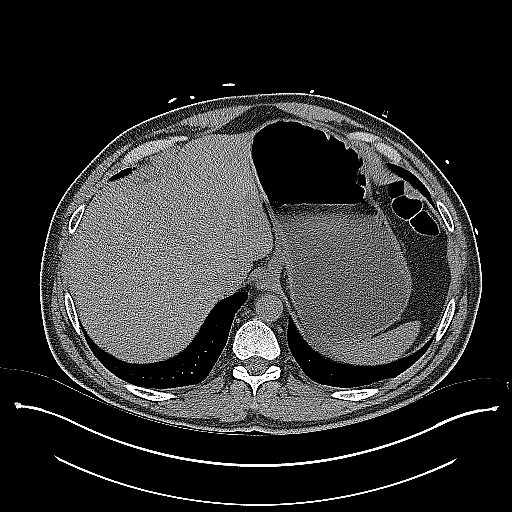
[im 36/65  soft-tissue]
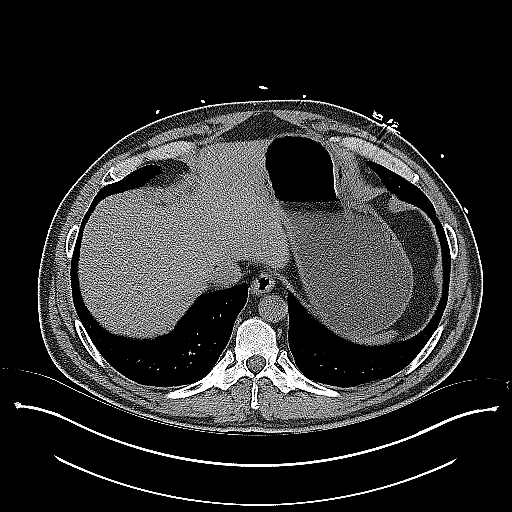
[im 40/65  soft-tissue]
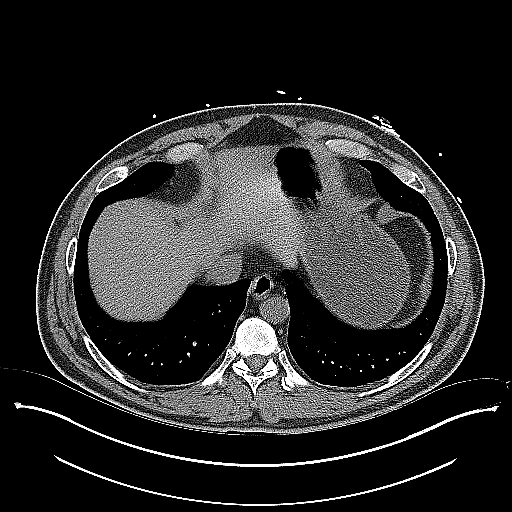
[im 46/65  soft-tissue]
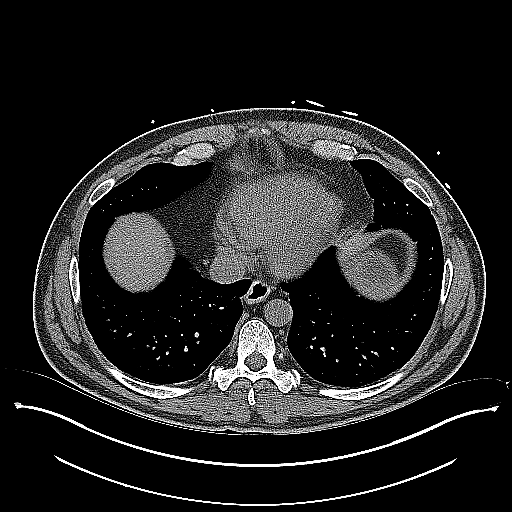
[im 46/65  bone]
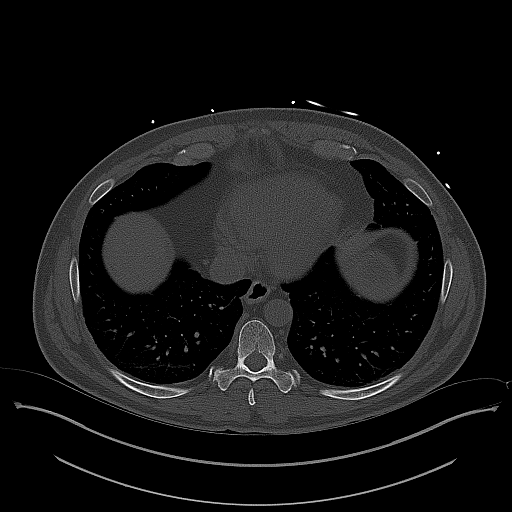
[im 50/65  soft-tissue]
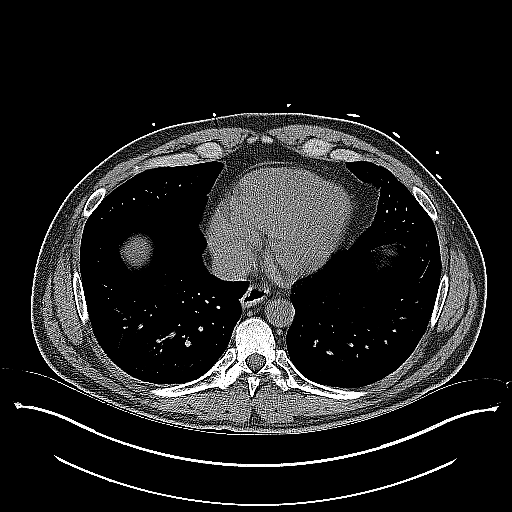
[im 56/65  soft-tissue]
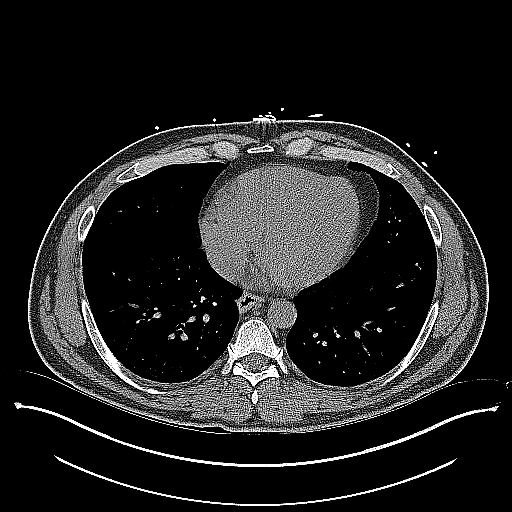
[im 60/65  soft-tissue]
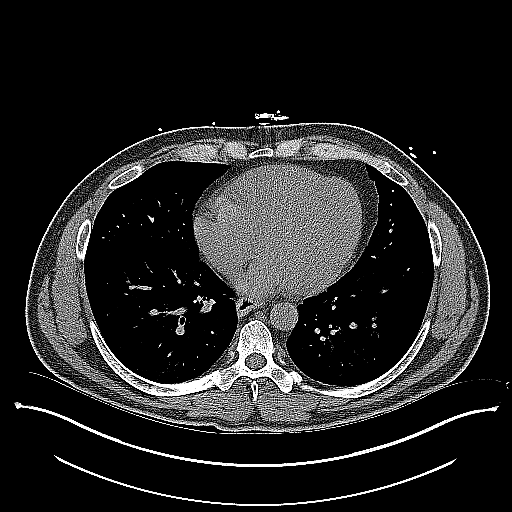

[15 of 46 positions shown; findings below may reference images not displayed]

FINDINGS: Lower chest:  No acute findings.

Hepatobiliary: No mass visualized within the liver on this
unenhanced exam. Gallbladder appears normal.

Pancreas: No mass or inflammatory process identified on this
un-enhanced exam.

Spleen: Within normal limits in size.

Adrenals/Urinary Tract: Adrenal glands appear normal. Kidneys are
unremarkable without mass, stone or hydronephrosis. No perinephric
inflammation no ureteral or bladder calculi identified. Bladder
appears normal, partially decompressed.

Prostate gland with a questionable small nodular component
superiorly abutting the bladder base.

Stomach/Bowel: Bowel is normal in caliber. There is tortuosity of
the colon, but bowel is otherwise normal in configuration. Appendix
is normal. Moderate amount of stool and gas throughout the colon.
Stomach moderately distended with fluid and air. No evidence of
bowel wall inflammation seen.

Vascular/Lymphatic: Scattered mild atherosclerotic changes of the
infrarenal abdominal aorta and pelvic vasculature. No enlarged lymph
nodes seen within the abdomen or pelvis.

Reproductive: No mass or other significant abnormality. Questionable
nodularity of the prostate gland, as above.

Other: No free fluid or abscess collection identified. No free
intraperitoneal air.

Musculoskeletal: No acute or suspicious osseous finding. No
significant degenerative change seen in the spine. Superficial soft
tissues are unremarkable.
IMPRESSION: 1. Prostate gland with a questionable small nodular component
superiorly abutting the bladder base, of uncertain significance.
Bladder appears otherwise normal. No bladder stone.
2. No acute findings. No renal or ureteral calculi. No perinephric
inflammation. No free fluid. Appendix is normal. No acute or
suspicious osseous finding.
3. Moderate amount of stool and gas throughout the nondistended
colon. Stomach also moderately distended with fluid and air. No
bowel obstruction or evidence of bowel wall inflammation seen.

## 2021-06-16 ENCOUNTER — Emergency Department (HOSPITAL_COMMUNITY)
Admission: EM | Admit: 2021-06-16 | Discharge: 2021-06-16 | Disposition: A | Discharge status: Home / Self Care | Payer: Medicaid Other | Attending: Emergency Medicine | Admitting: Emergency Medicine

## 2021-06-16 ENCOUNTER — Encounter (HOSPITAL_COMMUNITY): Payer: Self-pay | Admitting: Emergency Medicine

## 2021-06-16 ENCOUNTER — Emergency Department (HOSPITAL_COMMUNITY): Payer: Medicaid Other

## 2021-06-16 DIAGNOSIS — F17210 Nicotine dependence, cigarettes, uncomplicated: Secondary | ICD-10-CM | POA: Insufficient documentation

## 2021-06-16 DIAGNOSIS — M5126 Other intervertebral disc displacement, lumbar region: Secondary | ICD-10-CM | POA: Insufficient documentation

## 2021-06-16 LAB — URINALYSIS, ROUTINE W REFLEX MICROSCOPIC
Bilirubin Urine: NEGATIVE
Glucose, UA: NEGATIVE mg/dL
Hgb urine dipstick: NEGATIVE
Ketones, ur: NEGATIVE mg/dL
Leukocytes,Ua: NEGATIVE
Nitrite: NEGATIVE
Protein, ur: NEGATIVE mg/dL
Specific Gravity, Urine: 1.004 — ABNORMAL LOW (ref 1.005–1.030)
pH: 7 (ref 5.0–8.0)

## 2021-06-16 MED ORDER — HYDROMORPHONE HCL 1 MG/ML IJ SOLN
1.0000 mg | Freq: Once | INTRAMUSCULAR | Status: AC
  Administered 2021-06-16: 1 mg via INTRAVENOUS
  Filled 2021-06-16: qty 1

## 2021-06-16 MED ORDER — KETOROLAC TROMETHAMINE 15 MG/ML IJ SOLN
15.0000 mg | Freq: Once | INTRAMUSCULAR | Status: AC
  Administered 2021-06-16: 15 mg via INTRAVENOUS
  Filled 2021-06-16: qty 1

## 2021-06-16 MED ORDER — LIDOCAINE 5 % EX PTCH: 1.0000 | MEDICATED_PATCH | CUTANEOUS | 0 refills | Status: AC

## 2021-06-16 MED ORDER — ONDANSETRON HCL 4 MG/2ML IJ SOLN
4.0000 mg | Freq: Once | INTRAMUSCULAR | Status: AC
  Administered 2021-06-16: 4 mg via INTRAVENOUS
  Filled 2021-06-16: qty 2

## 2021-06-16 MED ORDER — METHYLPREDNISOLONE 4 MG PO TBPK: ORAL_TABLET | ORAL | 0 refills | Status: AC

## 2021-06-16 NOTE — ED Provider Notes (Signed)
? ?[facility] ?Provider Note: Keith Dell, MD, FACEP ? ?CSN: [id number] ?MRN: [id number] ?ARRIVAL: 06/16/21 at 0101 ?ROOM: [id number]/[id number] ? ? ?CHIEF COMPLAINT  ?Back Pain ? ? ?HISTORY OF PRESENT ILLNESS  ?06/16/21 1:19 AM ?Keith [name] is a 51 y.o. male with back pain that began 3 weeks ago.  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  He has been seen at [facility] and has been given hydrocodone under a pain management contract.  He was also given naproxen but has not been taking it.  His pain worsened about a week ago and acutely worsened yesterday today.  He rates his pain is severe and it is present in his left lower back radiating around to his groin fold and his left testicle.  He has not noticed any swelling or mass of his testicle.  He has had no bowel or bladder changes except that it is difficult to defecate because a Valsalva maneuver increases his pain.  He denies numbness in his legs but is having difficulty moving his left leg due to pain.  He had plain films which were unremarkable and is requesting an MRI. ? ? ?Past Medical History:  ?Diagnosis Date  ? Jaw fracture (HCC)   ? ? ?Past Surgical History:  ?Procedure Laterality Date  ? MANDIBLE SURGERY    ? ? ?History reviewed. No pertinent family history. ? ?Social History  ? ?Tobacco Use  ? Smoking status: Every Day  ?  Packs/day: 1.00  ?  Types: Cigarettes  ? Smokeless tobacco: Never  ?Vaping Use  ? Vaping Use: Never used  ?Substance Use Topics  ? Alcohol use: Yes  ?  Comment: 5x a week/beer  ? Drug use: Yes  ?  Types: Marijuana  ?  Comment: today  ? ? ?Prior to Admission medications   ?Medication Sig Start Date End Date Taking? Authorizing Provider  ?lidocaine (LIDODERM) 5 % Place 1 patch onto the skin daily. Remove & Discard patch within 12 hours or as directed by MD 06/16/21  Yes Horton, Clabe Seal, DO  ?methylPREDNISolone (MEDROL DOSEPAK) 4 MG TBPK tablet Take as directed 06/16/21   Yes Horton, Clabe Seal, DO  ?hydrocortisone cream 1 % Apply to affected area 2 times daily 11/29/17   Derwood Kaplan, MD  ?ibuprofen (ADVIL,MOTRIN) 800 MG tablet Take 1 tablet (800 mg total) by mouth 3 (three) times daily. 11/07/15   Glynn Octave, MD  ?omeprazole (PRILOSEC) 40 MG capsule Take 1 capsule (40 mg total) by mouth 2 (two) times daily before a meal. 08/14/16 08/29/16  Isa Rankin, MD  ? ? ?Allergies ?Vicodin [hydrocodone-acetaminophen] ? ? ?REVIEW OF SYSTEMS  ?Negative except as noted here or in the History of Present Illness. ? ? ?PHYSICAL EXAMINATION  ?Initial Vital Signs ?Blood pressure 113/77, pulse 60, temperature 98.2 ?F (36.8 ?C), temperature source Oral, resp. rate 19, height 6\' 3"  (1.905 m), weight 91.7 kg, SpO2 96 %. ? ?Examination ?General: Well-developed, well-nourished male in no acute distress; appearance consistent with age of record ?HENT: normocephalic; atraumatic ?Eyes: Normal appearance ?Neck: supple ?Heart: regular rate and rhythm ?Lungs: clear to auscultation bilaterally ?Abdomen: soft; nondistended; nontender; bowel sounds present ?Back: Left lower back tenderness; left groin fold tenderness ?Extremities: No deformity; full range of motion; pulses normal ?Neurologic: Awake, alert and oriented; motor function intact in all extremities but limited exam in left lower extremity due to pain; sensation intact and symmetric in lower extremities; no  saddle anesthesia; no facial droop ?Skin: Warm and dry ?Psychiatric: Grimacing ? ? ?RESULTS  ?Summary of this visit's results, reviewed and interpreted by myself: ? ? EKG Interpretation ? ?Date/Time:    ?Ventricular Rate:    ?PR Interval:    ?QRS Duration:   ?QT Interval:    ?QTC Calculation:   ?R Axis:     ?Text Interpretation:   ?  ? ?  ? ?Laboratory Studies: ?Results for orders placed or performed during the hospital encounter of 06/16/21 (from the past 24 hour(s))  ?Urinalysis, Routine w reflex microscopic Urine, Clean Catch      Status: Abnormal  ? Collection Time: 06/16/21  2:04 AM  ?Result Value Ref Range  ? Color, Urine STRAW (A) YELLOW  ? APPearance CLEAR CLEAR  ? Specific Gravity, Urine 1.004 (L) 1.005 - 1.030  ? pH 7.0 5.0 - 8.0  ? Glucose, UA NEGATIVE NEGATIVE mg/dL  ? Hgb urine dipstick NEGATIVE NEGATIVE  ? Bilirubin Urine NEGATIVE NEGATIVE  ? Ketones, ur NEGATIVE NEGATIVE mg/dL  ? Protein, ur NEGATIVE NEGATIVE mg/dL  ? Nitrite NEGATIVE NEGATIVE  ? Leukocytes,Ua NEGATIVE NEGATIVE  ? ?Imaging Studies: ?MR LUMBAR SPINE WO CONTRAST ? ?Result Date: 06/16/2021 ?CLINICAL DATA:  Low back pain after lifting injury 2 weeks ago EXAM: MRI LUMBAR SPINE WITHOUT CONTRAST TECHNIQUE: Multiplanar, multisequence MR imaging of the lumbar spine was performed. No intravenous contrast was administered. COMPARISON:  None. FINDINGS: Segmentation:  5 lumbar type vertebrae Alignment:  Straightening of lumbar lordosis Vertebrae: Mild discogenic endplate edema at J1-H4. No fracture, discitis, or aggressive bone lesion Conus medullaris and cauda equina: Conus extends to the T12-L1 level. Conus and cauda equina appear normal. Paraspinal and other soft tissues: Negative for perispinal mass or inflammation Disc levels: T12- L1: Unremarkable. L1-L2: Tiny left paracentral protrusion L2-L3: Unremarkable. L3-L4: Unremarkable. L4-L5: Mild annulus bulging. Tiny left foraminal protrusion based on sagittal images L5-S1:Disc narrowing and bulging with mild endplate spurring. Left paracentral extrusion impinging on the left S1 nerve root. Negative facets IMPRESSION: L5-S1 left paracentral extrusion impinging on the left S1 nerve root. Electronically Signed   By: Tiburcio Pea M.D.   On: 06/16/2021 08:27   ? ?ED COURSE and MDM  ?Nursing notes, initial and subsequent vitals signs, including pulse oximetry, reviewed and interpreted by myself. ? ?Vitals:  ? 06/16/21 0857 06/16/21 0900 06/16/21 0930 06/16/21 1025  ?BP: 125/74 139/70  135/79  ?Pulse: (!) 56 61  68  ?Resp: 17    18  ?Temp:      ?TempSrc:      ?SpO2: 100% 100% 98% 97%  ?Weight:      ?Height:      ? ?Medications  ?ketorolac (TORADOL) 15 MG/ML injection 15 mg (15 mg Intravenous Given 06/16/21 0148)  ?HYDROmorphone (DILAUDID) injection 1 mg (1 mg Intravenous Given 06/16/21 0148)  ?ondansetron [facility]) injection 4 mg (4 mg Intravenous Given 06/16/21 0148)  ?HYDROmorphone (DILAUDID) injection 1 mg (1 mg Intravenous Given 06/16/21 0904)  ? ?We will treat patient's pain with IV Dilaudid and Toradol and obtain a noncontrasted MRI of the lumbar spine later this morning. ? ? ?PROCEDURES  ?Procedures ? ? ?ED DIAGNOSES  ? ?  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  ? ? ? ?  ?Paula Libra, MD ?06/16/21 2239 ? ?

## 2021-06-16 NOTE — ED Notes (Signed)
Patient transported to MRI 

## 2021-06-16 NOTE — ED Notes (Signed)
Pt ambulated to restroom without distress.  

## 2021-06-16 NOTE — ED Notes (Signed)
Patient been ambulating in the hallway. ?

## 2021-06-16 NOTE — ED Notes (Signed)
Pt asleep and appears to be comfortable

## 2021-06-16 NOTE — ED Triage Notes (Signed)
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 Pt has been seen at [facility] center for this and was given percocets. Has naproxen but has not taken. He is seen at [facility] at pain management. Denies bowel and bladder incontinence.  ?

## 2021-06-16 NOTE — ED Provider Notes (Signed)
Patient signed out to me by previous provider. Please refer to their note for full HPI.  Briefly this is a 51 year old male presents emergency department with ongoing lower back pain.  Plan was for MRI this morning.  Otherwise is neuro intact. ?Physical Exam  ?BP 135/79 (BP Location: Left Arm)   Pulse 68   Temp 98.2 ?F (36.8 ?C) (Oral)   Resp 18   Ht 6\' 3"  (1.905 m)   Wt 91.7 kg   SpO2 97%   BMI 25.26 kg/m?  ? ?Physical Exam ?Vitals and nursing note reviewed.  ?Constitutional:   ?   Appearance: Normal appearance.  ?HENT:  ?   Head: Normocephalic.  ?   Mouth/Throat:  ?   Mouth: Mucous membranes are moist.  ?Cardiovascular:  ?   Rate and Rhythm: Normal rate.  ?Pulmonary:  ?   Effort: Pulmonary effort is normal. No respiratory distress.  ?Skin: ?   General: Skin is warm.  ?Neurological:  ?   Mental Status: He is alert and oriented to person, place, and time. Mental status is at baseline.  ?   Comments: Steady gait  ?Psychiatric:     ?   Mood and Affect: Mood normal.  ? ? ?Procedures  ?Procedures ? ?ED Course / MDM  ?  ?Medical Decision Making ?Amount and/or Complexity of Data Reviewed ?Labs: ordered. ?Radiology: ordered. ? ?Risk ?Prescription drug management. ? ? ?MRI reveals an L5-S1 paracentral disc herniation.  No s/s or findings of cauda equina. Patient is neuro intact.  Improvement with IV pain medicine.  He is already prescribed opiates as an outpatient.  We will add Lidoderm patch and Medrol Dosepak to his regimen and refer to neurosurgery.  Patient at this time appears safe and stable for discharge and close outpatient follow up. Discharge plan and strict return to ED precautions discussed, patient verbalizes understanding and agreement. ? ? ? ? ?  ? , DO ?06/16/21 1039 ? ?

## 2021-06-16 NOTE — ED Notes (Signed)
Pt in MRI.

## 2021-06-16 NOTE — Discharge Instructions (Addendum)
You have been seen and discharged from the emergency department.  Use pain patches as directed.  Take steroid Dosepak as prescribed.  You may continue to use the medications that have been given to you for pain management.  Stay well-hydrated.  Establish care and follow-up with neurosurgery for further management.  Follow-up with your primary provider for further evaluation and further care. Take home medications as prescribed. If you have any worsening symptoms or further concerns for your health please return to an emergency department for further evaluation. ?

## 2021-06-16 NOTE — ED Notes (Signed)
Pt in restroom 

## 2023-12-26 ENCOUNTER — Other Ambulatory Visit: Payer: Self-pay

## 2023-12-26 ENCOUNTER — Encounter (HOSPITAL_COMMUNITY): Payer: Self-pay | Admitting: Emergency Medicine

## 2023-12-26 ENCOUNTER — Emergency Department (HOSPITAL_COMMUNITY)
Admission: EM | Admit: 2023-12-26 | Discharge: 2023-12-26 | Discharge status: Against Medical Advice | Attending: Emergency Medicine | Admitting: Emergency Medicine

## 2023-12-26 DIAGNOSIS — M5442 Lumbago with sciatica, left side: Secondary | ICD-10-CM | POA: Diagnosis not present

## 2023-12-26 DIAGNOSIS — M545 Low back pain, unspecified: Secondary | ICD-10-CM | POA: Diagnosis present

## 2023-12-26 DIAGNOSIS — M5432 Sciatica, left side: Secondary | ICD-10-CM

## 2023-12-26 LAB — CBC
HCT: 48.6 % (ref 39.0–52.0)
Hemoglobin: 15.7 g/dL (ref 13.0–17.0)
MCH: 30.7 pg (ref 26.0–34.0)
MCHC: 32.3 g/dL (ref 30.0–36.0)
MCV: 94.9 fL (ref 80.0–100.0)
Platelets: 312 K/uL (ref 150–400)
RBC: 5.12 MIL/uL (ref 4.22–5.81)
RDW: 12.6 % (ref 11.5–15.5)
WBC: 6.5 K/uL (ref 4.0–10.5)
nRBC: 0 % (ref 0.0–0.2)

## 2023-12-26 LAB — BASIC METABOLIC PANEL WITH GFR
Anion gap: 15 (ref 5–15)
BUN: 6 mg/dL (ref 6–20)
CO2: 20 mmol/L — ABNORMAL LOW (ref 22–32)
Calcium: 8.9 mg/dL (ref 8.9–10.3)
Chloride: 107 mmol/L (ref 98–111)
Creatinine, Ser: 0.75 mg/dL (ref 0.61–1.24)
GFR, Estimated: >60 mL/min (ref >60)
Glucose, Bld: 80 mg/dL (ref 70–99)
Potassium: 3.8 mmol/L (ref 3.5–5.1)
Sodium: 142 mmol/L (ref 135–145)

## 2023-12-26 MED ORDER — FENTANYL CITRATE PF 50 MCG/ML IJ SOSY
50.0000 ug | PREFILLED_SYRINGE | Freq: Once | INTRAMUSCULAR | Status: AC
  Administered 2023-12-26: 50 ug via INTRAVENOUS
  Filled 2023-12-26: qty 1

## 2023-12-26 MED ORDER — LIDOCAINE 5 % EX PTCH
1.0000 | MEDICATED_PATCH | CUTANEOUS | Status: DC
  Administered 2023-12-26: 1 via TRANSDERMAL
  Filled 2023-12-26: qty 1

## 2023-12-26 MED ORDER — KETOROLAC TROMETHAMINE 15 MG/ML IJ SOLN
15.0000 mg | Freq: Once | INTRAMUSCULAR | Status: AC
  Administered 2023-12-26: 15 mg via INTRAVENOUS
  Filled 2023-12-26: qty 1

## 2023-12-26 NOTE — ED Triage Notes (Signed)
 Patient BIB EMS from home c/o back pain. Patient report pain started after trying to get in the car yesterday morning. Patient report back pain getting worse tonight. Patient report taking tylenol  and naprosyn for pain without relief. Hx of back Injury 2 years ago.

## 2023-12-26 NOTE — ED Notes (Signed)
 Patient stood up, walked to the nursing station, and removed his IV. The patient stated, If this is help, I don't want your help. You're not helping me at all. The RN attempted to talk to the patient and suggested that the patient could speak with the P.A. However, the patient ignored the RN and left the department. The P.A. was informed.

## 2023-12-26 NOTE — ED Notes (Signed)
 The patient complained of back pain. The RN stated, We just gave you pain medicine, can we wait for it to work? The patient responded that the medication was not working and asked for more pain medicine. The patient then said, I'm going to leave now; nobody is helping me. The RN tried to convince the patient to stay, and the patient agreed to remain.

## 2023-12-26 NOTE — ED Provider Notes (Signed)
 [name] EMERGENCY DEPARTMENT AT [facility] Provider Note   CSN: [id number] Arrival date & time: 12/26/23  0002     Patient presents with: Back Pain   Keith [name] is a 53 y.o. male with known history of Lumbar disc herniation documented on MRI of the lumbar spine in 2023.  He states that he chronically has low back pain worse on the left than the right with some radiating pain down the left leg, however yesterday was getting down into his daughter's vehicle which is much lower to the ground than his does.  He states when he did this his left lower back pain suddenly worsened in the left buttock with radiating shooting pain down his left leg.  No weakness in the leg, denies saddle anesthesia, denies urinary or fecal incontinence or retention.  No fevers or chills recent procedures to the spine. No relief with naproxen or tylenol .    HPI     Prior to Admission medications   Medication Sig Start Date End Date Taking? Authorizing Provider  hydrocortisone  cream 1 % Apply to affected area 2 times daily 11/29/17   Charlyn Sora, MD  ibuprofen  (ADVIL ,MOTRIN ) 800 MG tablet Take 1 tablet (800 mg total) by mouth 3 (three) times daily. 11/07/15   Rancour, Garnette, MD  lidocaine  (LIDODERM ) 5 % Place 1 patch onto the skin daily. Remove & Discard patch within 12 hours or as directed by MD 06/16/21   Horton, Roxie HERO, DO  methylPREDNISolone  (MEDROL  DOSEPAK) 4 MG TBPK tablet Take as directed 06/16/21   Horton, Kristie M, DO  omeprazole  (PRILOSEC) 40 MG capsule Take 1 capsule (40 mg total) by mouth 2 (two) times daily before a meal. 08/14/16 08/29/16  Jason Leita Blush, MD    Allergies: Vicodin [hydrocodone-acetaminophen ]    Review of Systems  Genitourinary:  Negative for difficulty urinating.  Musculoskeletal:  Positive for back pain.    Updated Vital Signs BP (!) 151/96 (BP Location: Right Arm)   Pulse 68   Temp 97.7 F (36.5 C) (Oral)   Resp 20   SpO2 100%   Physical  Exam Vitals and nursing note reviewed.  Constitutional:      Appearance: He is not ill-appearing or toxic-appearing.  HENT:     Head: Normocephalic and atraumatic.     Mouth/Throat:     Mouth: Mucous membranes are moist.     Pharynx: No oropharyngeal exudate or posterior oropharyngeal erythema.  Eyes:     General:        Right eye: No discharge.        Left eye: No discharge.     Conjunctiva/sclera: Conjunctivae normal.  Cardiovascular:     Rate and Rhythm: Normal rate and regular rhythm.     Pulses: Normal pulses.     Heart sounds: Normal heart sounds.  Pulmonary:     Effort: Pulmonary effort is normal. No respiratory distress.     Breath sounds: Normal breath sounds. No wheezing or rales.  Abdominal:     General: Bowel sounds are normal. There is no distension.     Palpations: Abdomen is soft.     Tenderness: There is no abdominal tenderness.  Musculoskeletal:        General: No deformity.     Cervical back: Neck supple. No bony tenderness.     Thoracic back: No bony tenderness.     Lumbar back: Spasms and tenderness present. No bony tenderness. Negative left straight leg raise test.  Back:  Skin:    General: Skin is warm and dry.  Neurological:     Mental Status: He is alert and oriented to person, place, and time. Mental status is at baseline.     Sensory: Sensation is intact.     Motor: Motor function is intact.     Gait: Gait is intact.  Psychiatric:        Mood and Affect: Mood normal.     (all labs ordered are listed, but only abnormal results are displayed) Labs Reviewed  BASIC METABOLIC PANEL WITH GFR - Abnormal; Notable for the following components:      Result Value   CO2 20 (*)    All other components within normal limits  CBC    EKG: None  Radiology: No results found.   Procedures   Medications Ordered in the ED  lidocaine  (LIDODERM ) 5 % 1 patch (1 patch Transdermal Patch Applied 12/26/23 0042)  fentaNYL  (SUBLIMAZE ) injection 50 mcg  (50 mcg Intravenous Given 12/26/23 0042)  ketorolac  (TORADOL ) 15 MG/ML injection 15 mg (15 mg Intravenous Given 12/26/23 0042)                                    Medical Decision Making 53 year old male presents with low back pain rating on the left leg.  Mildly hypertensive on intake and vital signs as normal.  Cardiopulmonary abdominal send benign.  Neurologic exam is reassuring.  No saddle anesthesia per patient, symmetric strength and sensation lower extremities.  Patient ambulatory in the ED.  The emergent differential diagnosis for low back pain includes acute ligamentous and muscular injury, cord compression syndrome, pathologic fracture, transverse myelitis, vertebral osteomyelitis, discitis, and epidural abscess.   Amount and/or Complexity of Data Reviewed Labs: ordered.    Details: CBC unremarkable, BMP unremarkable.  Risk Prescription drug management.   Clinical patient most consistent with sciatica.  Treated symptomatically in the emergency department and outpatient management with PT and specialist follow-up was discussed.  Clinical concern for emergent underlying etiology of this patient's symptoms that would warrant further ED workup and patient management is exceedingly low.  Keith [name] eloped from the emergency department, ambulating independently per RN, prior to my return to the bedside to discuss his laboratory studies and definitive outpatient plan.  You may return anytime.  This chart was dictated using voice recognition software, Dragon. Despite the best efforts of this provider to proofread and correct errors, errors may still occur which can change documentation meaning.      Final diagnoses:  Sciatica of left side    ED Discharge Orders     None          Bobette Pleasant JONELLE DEVONNA 12/26/23 0151    Raford Lenis, MD 12/26/23 [phone]
